# Patient Record
Sex: Female | Born: 1970 | Race: White | Hispanic: No | Marital: Married | Smoking: Never smoker
Health system: Southern US, Community
[De-identification: ages and names within clinical notes are randomized; demographics above are authoritative.]

## PROBLEM LIST (undated history)

## (undated) DIAGNOSIS — G35 Multiple sclerosis: Secondary | ICD-10-CM

## (undated) DIAGNOSIS — C55 Malignant neoplasm of uterus, part unspecified: Secondary | ICD-10-CM

---

## 1998-09-07 DIAGNOSIS — C55 Malignant neoplasm of uterus, part unspecified: Secondary | ICD-10-CM

## 1998-09-07 HISTORY — DX: Malignant neoplasm of uterus, part unspecified: C55

## 1998-09-07 HISTORY — PX: ABDOMINAL HYSTERECTOMY: SHX81

## 2001-03-15 ENCOUNTER — Other Ambulatory Visit: Admission: RE | Admit: 2001-03-15 | Discharge: 2001-03-15 | Payer: Self-pay | Admitting: Obstetrics and Gynecology

## 2001-12-28 ENCOUNTER — Encounter: Payer: Self-pay | Admitting: Obstetrics and Gynecology

## 2001-12-28 ENCOUNTER — Ambulatory Visit (HOSPITAL_COMMUNITY): Admission: RE | Admit: 2001-12-28 | Discharge: 2001-12-28 | Payer: Self-pay | Admitting: Obstetrics and Gynecology

## 2002-05-16 ENCOUNTER — Other Ambulatory Visit: Admission: RE | Admit: 2002-05-16 | Discharge: 2002-05-16 | Payer: Self-pay | Admitting: Obstetrics and Gynecology

## 2002-11-24 ENCOUNTER — Encounter: Payer: Self-pay | Admitting: Emergency Medicine

## 2002-11-24 ENCOUNTER — Emergency Department (HOSPITAL_COMMUNITY): Admission: EM | Admit: 2002-11-24 | Discharge: 2002-11-24 | Payer: Self-pay | Admitting: Emergency Medicine

## 2003-06-20 ENCOUNTER — Other Ambulatory Visit: Admission: RE | Admit: 2003-06-20 | Discharge: 2003-06-20 | Payer: Self-pay | Admitting: Obstetrics and Gynecology

## 2004-07-10 ENCOUNTER — Other Ambulatory Visit: Admission: RE | Admit: 2004-07-10 | Discharge: 2004-07-10 | Payer: Self-pay | Admitting: Obstetrics and Gynecology

## 2005-11-12 ENCOUNTER — Other Ambulatory Visit: Admission: RE | Admit: 2005-11-12 | Discharge: 2005-11-12 | Payer: Self-pay | Admitting: Obstetrics and Gynecology

## 2012-07-08 ENCOUNTER — Encounter (HOSPITAL_COMMUNITY): Payer: Self-pay | Admitting: Emergency Medicine

## 2012-07-08 ENCOUNTER — Emergency Department (INDEPENDENT_AMBULATORY_CARE_PROVIDER_SITE_OTHER)
Admission: EM | Admit: 2012-07-08 | Discharge: 2012-07-08 | Disposition: A | Discharge status: Home / Self Care | Payer: BC Managed Care – PPO | Source: Home / Self Care

## 2012-07-08 DIAGNOSIS — H538 Other visual disturbances: Secondary | ICD-10-CM

## 2012-07-08 DIAGNOSIS — H532 Diplopia: Secondary | ICD-10-CM

## 2012-07-08 NOTE — ED Provider Notes (Signed)
History     CSN: [id number]  Arrival date & time 07/08/12  1143   None     Chief Complaint  Patient presents with  . Visual Field Change    (Consider location/radiation/quality/duration/timing/severity/associated sxs/prior treatment) HPI Comments: One-week ago, this 41 year old female experiencing acute onset of being off balance. Ambulation. Check. Trouble focusing particularly with of right followed by diplopia. Diplopia was a vertical and did get worse over. Couple of days, but has been stable for the past 4-5 days. She saw an optometrist, who told her that she had a normal eye exam shows a saw her PCP that told her that she probably has an upper respirator infection and was given symptomatic medications for that including a nasal spray meclizine and an antibiotic. At times when gazing right or left shoulder become nauseated and dizzy. Other times when looking straight ahead. The images will drop or fall in her words. She denies problems with speech, hearing or swallowing, unilateral paresthesias, or weakness, incontinence, abdominal pain, chest pain, shortness of breath.   History reviewed. No pertinent past medical history.  Past Surgical History  Procedure Date  . Abdominal hysterectomy     No family history on file.  History  Substance Use Topics  . Smoking status: Never Smoker   . Smokeless tobacco: Not on file  . Alcohol Use: No    OB History    Grav Para Term Preterm Abortions TAB SAB Ect Mult Living                  Review of Systems  Constitutional: Negative for fever and activity change.  HENT: Positive for neck stiffness. Negative for hearing loss, ear pain, nosebleeds, facial swelling, rhinorrhea, trouble swallowing, dental problem, postnasal drip and tinnitus.   Eyes: Positive for visual disturbance. Negative for photophobia, pain, discharge, redness and itching.  Cardiovascular: Negative.   Gastrointestinal: Positive for nausea. Negative for vomiting,  abdominal pain and blood in stool.  Genitourinary: Negative.   Musculoskeletal: Positive for myalgias. Negative for back pain.  Skin: Negative.   Neurological: Positive for dizziness, facial asymmetry and light-headedness. Negative for tremors, syncope, speech difficulty and weakness.  Psychiatric/Behavioral: Negative.     Allergies  Review of patient's allergies indicates no known allergies.  Home Medications   Current Outpatient Rx  Name Route Sig Dispense Refill  . ESTROGENS CONJUGATED 0.9 MG PO TABS Oral Take 0.9 mg by mouth daily. Take daily for 21 days then do not take for 7 days.    Marland Kitchen FLUTICASONE FUROATE 27.5 MCG/SPRAY NA SUSP Nasal Place 2 sprays into the nose daily.    Marland Kitchen MECLIZINE HCL 25 MG PO TABS Oral Take 25 mg by mouth 3 (three) times daily as needed.    Marland Kitchen BACTRIM PO Oral Take by mouth.      BP 149/81  Pulse 88  Temp 98.2 F (36.8 C) (Oral)  Resp 18  SpO2 100%  Physical Exam  Constitutional: She is oriented to person, place, and time. She appears well-developed and well-nourished.  HENT:  Head: Normocephalic and atraumatic.  Right Ear: External ear normal.  Left Ear: External ear normal.  Mouth/Throat: Oropharynx is clear and moist. No oropharyngeal exudate.  Neck: Normal range of motion. Neck supple.  Cardiovascular: Normal rate and normal heart sounds.   Pulmonary/Chest: Effort normal and breath sounds normal. No respiratory distress.  Abdominal: Soft. There is no tenderness.  Musculoskeletal: She exhibits no edema and no tenderness.  Neurological: She is alert and oriented to  person, place, and time. She displays no atrophy and no tremor. A cranial nerve deficit is present. No sensory deficit. She exhibits normal muscle tone. She displays no seizure activity. GCS eye subscore is 4. GCS verbal subscore is 5. GCS motor subscore is 6.       Positive for nystagmus with right and lateral gaze. Following the examiner's finger bilaterally. There is a lag of the eyes  especially the left when following the finger from one side to the other. There is also fatiguing evidenced by the eyes rolling in a downward-looking  fashion and When attempting to remain parallel to the floor when staring straight ahead at a distant object. Pupils equal, and reactive to light  Skin: Skin is warm and dry.    ED Course  Procedures (including critical care time)  Labs Reviewed - No data to display No results found.   1. Double vision with both eyes open   2. Blurred vision, bilateral       MDM  Differential may include cranial nerve 3 or 4 palsy Myasthenia gravis Multiple sclerosis Mass effect She is been stable since the onset on Friday, so do not feel that she needs to go to the emergency department. At this time. Also consulted with Dr. Lorenz Coaster I spoke with the receptionist at for neurologic center and the earliest time. They can get her in would be from November 6 at 1:30 PM, so we accepted this up appointment. The patient is instructed that if she develops additional symptoms or worsening of her current symptoms. She is to go to the emergency Department promptly or call EMS. Such symptoms could include worsening of double vision, vertigo, unusual sleepiness or confusion or disorientation, weakness, or paresthesias on one side of the body, incontinence, changes in speech, hearing, or swallowing.        Hayden Rasmussen, NP 07/08/12 1555

## 2012-07-08 NOTE — ED Notes (Signed)
Pt c/o blurry vision x1 week... Saw her optometrist Friday of last week and they told her everything looked normal and to f/u w/PCP... Saw PCP on Tuesday of this week and labs and visit was normal... Pt states that her vision is blurry when she has both eyes open but if she is to close one eye (left or right) she can see fine .Marland Kitchen Bought and eye patch but will get headaches after a while... Pt is alert w/no signs of distress.

## 2012-07-08 NOTE — ED Provider Notes (Signed)
Medical screening examination/treatment/procedure(s) were performed by non-physician practitioner and as supervising physician I was immediately available for consultation/collaboration.  Leslee Home, M.D.   Reuben Likes, MD 07/08/12 [phone]

## 2012-07-13 ENCOUNTER — Encounter (HOSPITAL_COMMUNITY): Payer: Self-pay | Admitting: Emergency Medicine

## 2012-07-13 ENCOUNTER — Emergency Department (HOSPITAL_COMMUNITY): Payer: BC Managed Care – PPO

## 2012-07-13 ENCOUNTER — Inpatient Hospital Stay (HOSPITAL_COMMUNITY)
Admission: EM | Admit: 2012-07-13 | Discharge: 2012-07-16 | DRG: 013 | Disposition: A | Discharge status: Home / Self Care | Payer: BC Managed Care – PPO | Attending: Family Medicine | Admitting: Family Medicine

## 2012-07-13 DIAGNOSIS — H5500 Unspecified nystagmus: Secondary | ICD-10-CM | POA: Diagnosis present

## 2012-07-13 DIAGNOSIS — G35 Multiple sclerosis: Principal | ICD-10-CM | POA: Diagnosis present

## 2012-07-13 DIAGNOSIS — Z8542 Personal history of malignant neoplasm of other parts of uterus: Secondary | ICD-10-CM

## 2012-07-13 DIAGNOSIS — H532 Diplopia: Secondary | ICD-10-CM | POA: Diagnosis present

## 2012-07-13 DIAGNOSIS — Z79899 Other long term (current) drug therapy: Secondary | ICD-10-CM

## 2012-07-13 HISTORY — DX: Multiple sclerosis: G35

## 2012-07-13 HISTORY — DX: Malignant neoplasm of uterus, part unspecified: C55

## 2012-07-13 LAB — URINALYSIS, ROUTINE W REFLEX MICROSCOPIC
Glucose, UA: NEGATIVE mg/dL
Leukocytes, UA: NEGATIVE
pH: 8 (ref 5.0–8.0)

## 2012-07-13 LAB — CBC WITH DIFFERENTIAL/PLATELET
Eosinophils Relative: 0 % (ref 0–5)
Lymphocytes Relative: 7 % — ABNORMAL LOW (ref 12–46)
Lymphs Abs: 1.3 10*3/uL (ref 0.7–4.0)
MCV: 89.6 fL (ref 78.0–100.0)
Neutrophils Relative %: 87 % — ABNORMAL HIGH (ref 43–77)
Platelets: 263 10*3/uL (ref 150–400)
RBC: 4.12 MIL/uL (ref 3.87–5.11)
WBC: 17.9 10*3/uL — ABNORMAL HIGH (ref 4.0–10.5)

## 2012-07-13 LAB — COMPREHENSIVE METABOLIC PANEL
Albumin: 3.8 g/dL (ref 3.5–5.2)
BUN: 11 mg/dL (ref 6–23)
Calcium: 9.4 mg/dL (ref 8.4–10.5)
Chloride: 100 mEq/L (ref 96–112)
Creatinine, Ser: 0.79 mg/dL (ref 0.50–1.10)
Total Bilirubin: 0.3 mg/dL (ref 0.3–1.2)
Total Protein: 7 g/dL (ref 6.0–8.3)

## 2012-07-13 LAB — APTT: aPTT: 30 seconds (ref 24–37)

## 2012-07-13 LAB — PROTIME-INR
INR: 1.07 (ref 0.00–1.49)
Prothrombin Time: 13.8 seconds (ref 11.6–15.2)

## 2012-07-13 MED ORDER — SENNOSIDES-DOCUSATE SODIUM 8.6-50 MG PO TABS: 1.0000 | ORAL_TABLET | Freq: Every evening | ORAL | Status: DC | PRN

## 2012-07-13 MED ORDER — ONDANSETRON HCL 4 MG PO TABS: 4.0000 mg | ORAL_TABLET | Freq: Four times a day (QID) | ORAL | Status: DC | PRN

## 2012-07-13 MED ORDER — ALUM & MAG HYDROXIDE-SIMETH 200-200-20 MG/5ML PO SUSP: 30.0000 mL | Freq: Four times a day (QID) | ORAL | Status: DC | PRN

## 2012-07-13 MED ORDER — SODIUM CHLORIDE 0.9 % IV SOLN
1000.0000 mg | INTRAVENOUS | Status: DC
  Filled 2012-07-13: qty 8

## 2012-07-13 MED ORDER — ENOXAPARIN SODIUM 40 MG/0.4ML ~~LOC~~ SOLN
40.0000 mg | SUBCUTANEOUS | Status: DC
  Administered 2012-07-14 – 2012-07-15 (×2): 40 mg via SUBCUTANEOUS
  Filled 2012-07-13 (×4): qty 0.4

## 2012-07-13 MED ORDER — ACETAMINOPHEN 650 MG RE SUPP: 650.0000 mg | Freq: Four times a day (QID) | RECTAL | Status: DC | PRN

## 2012-07-13 MED ORDER — ONDANSETRON HCL 4 MG/2ML IJ SOLN
4.0000 mg | Freq: Once | INTRAMUSCULAR | Status: AC
  Administered 2012-07-13: 4 mg via INTRAVENOUS
  Filled 2012-07-13: qty 2

## 2012-07-13 MED ORDER — ONDANSETRON HCL 4 MG/2ML IJ SOLN: 4.0000 mg | Freq: Four times a day (QID) | INTRAMUSCULAR | Status: DC | PRN

## 2012-07-13 MED ORDER — MECLIZINE HCL 25 MG PO TABS
25.0000 mg | ORAL_TABLET | Freq: Three times a day (TID) | ORAL | Status: DC | PRN
  Filled 2012-07-13: qty 1

## 2012-07-13 MED ORDER — ACETAMINOPHEN 325 MG PO TABS
650.0000 mg | ORAL_TABLET | Freq: Four times a day (QID) | ORAL | Status: DC | PRN
  Administered 2012-07-13 – 2012-07-16 (×7): 650 mg via ORAL
  Filled 2012-07-13 (×6): qty 2

## 2012-07-13 MED ORDER — ZOLPIDEM TARTRATE 5 MG PO TABS
5.0000 mg | ORAL_TABLET | Freq: Every evening | ORAL | Status: DC | PRN
  Administered 2012-07-15: 5 mg via ORAL
  Filled 2012-07-13: qty 1

## 2012-07-13 MED ORDER — SODIUM CHLORIDE 0.9 % IV BOLUS (SEPSIS)
1000.0000 mL | Freq: Once | INTRAVENOUS | Status: AC
  Administered 2012-07-13: 1000 mL via INTRAVENOUS

## 2012-07-13 NOTE — H&P (Signed)
[facility] History and Physical  Suzanne Robbins ZOX:[id number] DOB: 1970-09-11 DOA: 07/13/2012  Referring physician: EDP PCP: Horald Pollen., PA  Specialists: neurology  Chief Complaint: double vision  HPI: Suzanne Robbins is a 41 y.o. female with history of vague paresthesia for a few months, who presented to the Ed today with progressive worsening diplopia, vertigo, nausea and vomiting - worsening for the past 3 days. She has also having difficulty walking as she has to hold on to the walls not to fall. She denies any fever chills or headache. MRI with demyelinating lesions concerning for MS.    Review of Systems: The patient denies anorexia, fever, weight loss, decreased hearing, hoarseness, chest pain, syncope, dyspnea on exertion, peripheral edema, balance deficits, hemoptysis, abdominal pain, melena, hematochezia, severe indigestion/heartburn, hematuria, incontinence, genital sores, muscle weakness, suspicious skin lesions, transient blindness,depression, unusual weight change, abnormal bleeding, enlarged lymph nodes, angioedema, and breast masses.    Past Medical History  Diagnosis Date  . Uterine cancer    Past Surgical History  Procedure Date  . Abdominal hysterectomy 2000   Social History:  reports that she has never smoked. She does not have any smokeless tobacco history on file. She reports that she does not drink alcohol or use illicit drugs. Lives at home with daughter   works as a Psychologist, occupational  No Known Allergies  Family History  Problem Relation Age of Onset  . Lung cancer Father     Prior to Admission medications   Medication Sig Start Date End Date Taking? Authorizing Provider  Ascorbic Acid (VITAMIN C PO) Take 1 tablet by mouth daily.   Yes Historical Provider, MD  estrogens, conjugated, (PREMARIN) 0.9 MG tablet Take 0.9 mg by mouth daily. Take daily for 21 days then do not take for 7 days.   Yes Historical Provider, MD  fluticasone (VERAMYST) 27.5 MCG/SPRAY  nasal spray Place 2 sprays into the nose daily.   Yes Historical Provider, MD  ibuprofen (ADVIL,MOTRIN) 200 MG tablet Take 400 mg by mouth every 6 (six) hours as needed. For pain   Yes Historical Provider, MD  meclizine (ANTIVERT) 25 MG tablet Take 25 mg by mouth 3 (three) times daily as needed. For dizziness   Yes Historical Provider, MD   Physical Exam: Filed Vitals:   07/13/12 1213 07/13/12 1441 07/13/12 1640  BP: 126/73 112/65 116/70  Pulse: 94 78   Temp: 97.8 F (36.6 C) 98.4 F (36.9 C)   TempSrc: Oral Oral   Resp: 16 18 16   SpO2: 100% 100% 100%     General:  Axox3, no acute distress  Eyes: pupils are 4 mm symmetric, she has strabismus   ENT: no pharyngeal erythema   Neck: no JVD  Cardiovascular: RRR, no M, R,G   Respiratory: ctab no W,R,C  Abdomen: soft, NT, Bs present   Skin: warm, dry no rashes   Musculoskeletal: intact   Psychiatric: euthymic  Neurologic:  III,IV, VI: Horizontal nystagmus on lateral gaze right eye, decreased downward gaze on right eye. ,strength 5/5 all 4  Labs on Admission:  Basic Metabolic Panel:  Lab 07/13/12 8119  NA 136  K 4.0  CL 100  CO2 26  GLUCOSE 116*  BUN 11  CREATININE 0.79  CALCIUM 9.4  MG --  PHOS --   Liver Function Tests:  Lab 07/13/12 1216  AST 20  ALT 14  ALKPHOS 66  BILITOT 0.3  PROT 7.0  ALBUMIN 3.8   No results found for this basename: LIPASE:5,AMYLASE:5  in the last 168 hours No results found for this basename: AMMONIA:5 in the last 168 hours CBC:  Lab 07/13/12 1216  WBC 17.9*  NEUTROABS 15.6*  HGB 12.6  HCT 36.9  MCV 89.6  PLT 263   Cardiac Enzymes: No results found for this basename: CKTOTAL:5,CKMB:5,CKMBINDEX:5,TROPONINI:5 in the last 168 hours  BNP (last 3 results) No results found for this basename: PROBNP:3 in the last 8760 hours CBG: No results found for this basename: GLUCAP:5 in the last 168 hours  Radiological Exams on Admission: Mr Shirlee Latch Wo Contrast  07/13/2012   *RADIOLOGY REPORT*  Clinical Data:  Dizziness with blurred vision.  MRI HEAD WITHOUT CONTRAST MRA HEAD WITHOUT CONTRAST  Technique:  Multiplanar, multiecho pulse sequences of the brain and surrounding structures were obtained without intravenous contrast. Angiographic images of the head were obtained using MRA technique without contrast.  Comparison:   None  MRI HEAD  Findings:  There is an acute periventricular white matter lesion affecting the right optic radiation (image 12 series 4, arrow). Its periventricular location, as well as the presence of numerous other white matter lesions, many also periventricular and ovoid, raises the question of acute and chronic MS.  There is no hemorrhage, mass lesion, cortical involvement, hydrocephalus, or extra-axial fluid.  There is slight premature atrophy for age.  Numerous periventricular lesions are seen which do not show restricted diffusion.  Some of these display central brain substance loss or encephalomalacia.  One lesion involves the mammillothalamic tract on the right at the level of the anterior third ventricle, and location unusual for chronic microvascular ischemic change.  No other brainstem or cerebellar involvement although numerous non cystic periventricular lesions can be seen in both temporal lobes.  The carotid and basilar arteries widely patent.  The orbits appear negative.  There is no midline shift.  Unremarkable pituitary and cerebellar tonsils.  No visible upper cervical lesions.  IMPRESSION: Numerous white matter lesions which are predominately periventricular location suspicious for chronic MS, with a suspected acute demyelinating lesion in the right temporal lobe periventricular optic radiation.  Mild premature atrophy.  MRA HEAD  Findings: The internal carotid arteries are widely patent.  The basilar artery is widely patent with vertebrals codominant.  There is no intracranial stenosis or aneurysm.  IMPRESSION: Negative exam.   Original Report  Authenticated By: Davonna Belling, M.D.    Mr Brain Wo Contrast  07/13/2012  *RADIOLOGY REPORT*  Clinical Data:  Dizziness with blurred vision.  MRI HEAD WITHOUT CONTRAST MRA HEAD WITHOUT CONTRAST  Technique:  Multiplanar, multiecho pulse sequences of the brain and surrounding structures were obtained without intravenous contrast. Angiographic images of the head were obtained using MRA technique without contrast.  Comparison:   None  MRI HEAD  Findings:  There is an acute periventricular white matter lesion affecting the right optic radiation (image 12 series 4, arrow). Its periventricular location, as well as the presence of numerous other white matter lesions, many also periventricular and ovoid, raises the question of acute and chronic MS.  There is no hemorrhage, mass lesion, cortical involvement, hydrocephalus, or extra-axial fluid.  There is slight premature atrophy for age.  Numerous periventricular lesions are seen which do not show restricted diffusion.  Some of these display central brain substance loss or encephalomalacia.  One lesion involves the mammillothalamic tract on the right at the level of the anterior third ventricle, and location unusual for chronic microvascular ischemic change.  No other brainstem or cerebellar involvement although numerous non cystic periventricular  lesions can be seen in both temporal lobes.  The carotid and basilar arteries widely patent.  The orbits appear negative.  There is no midline shift.  Unremarkable pituitary and cerebellar tonsils.  No visible upper cervical lesions.  IMPRESSION: Numerous white matter lesions which are predominately periventricular location suspicious for chronic MS, with a suspected acute demyelinating lesion in the right temporal lobe periventricular optic radiation.  Mild premature atrophy.  MRA HEAD  Findings: The internal carotid arteries are widely patent.  The basilar artery is widely patent with vertebrals codominant.  There is no  intracranial stenosis or aneurysm.  IMPRESSION: Negative exam.   Original Report Authenticated By: Davonna Belling, M.D.       Assessment/Plan Principal Problem:  *Multiple sclerosis   1. Demyelinating disease - most likely MS plan for LP in AM per neuro to r/o viral meningitis - highly unlikely. Patient did receive 1 dose of pulse dose methylprednisolone on 07/13/12 . Further treatment per neurology.    Code Status: full Family Communication: mother Disposition Plan: home  (anticipated LOS 2-4 days )  Time spent: 45 minutes   Myrl Bynum [facility] Pager [phone]  If 7PM-7AM, please contact night-coverage [email or web address] Password [facility] 07/13/2012, 5:57 PM

## 2012-07-13 NOTE — ED Notes (Signed)
Regular Diet Tray ordered spoke w/Felicia

## 2012-07-13 NOTE — ED Notes (Signed)
Was seen for same issues of blurred vision/dizzy on 11/1 and had appointment today to see a dr and she started to vomit so mom brought her back to er

## 2012-07-13 NOTE — ED Notes (Signed)
Larita Fife PA Neurology at bedside.

## 2012-07-13 NOTE — ED Notes (Addendum)
Pt. Transferred to CDU 4, Pt.s bed assignment is complete.  Tammy Sours, RN will call report for this pt.  Neurologist at the bedside

## 2012-07-13 NOTE — ED Provider Notes (Signed)
Medical screening examination/treatment/procedure(s) were performed by non-physician practitioner and as supervising physician I was immediately available for consultation/collaboration.    Nelia Shi, MD 07/13/12 [phone]

## 2012-07-13 NOTE — ED Notes (Signed)
Patient seen recently at Urgent care and referred to Neurologist today did not go to appointment because patient developed nausea and emesis today. States depending on movement does become more dizzy with quick movements.  Airway intact bilateral equal chest rise and fall.  Patient resting at home since the appointment at Urgent care and one day ago fell trying to get the mail and denies hitting head braced fall with palm of hands. Denies pain currently.

## 2012-07-13 NOTE — ED Provider Notes (Signed)
History     CSN: [id number]  Arrival date & time 07/13/12  1205   First MD Initiated Contact with Patient 07/13/12 1502      No chief complaint on file.   (Consider location/radiation/quality/duration/timing/severity/associated sxs/prior treatment) HPI Comments: 41 y/o female presents to the ED complaining of worsening nausea and blurred vision since being seen at Urgent Care on 11/1. She was seen there due to "not feeling right" explained as nauseated, unsteady and blurred/double vision worse in her right eye. Prior to going to urgent care, she went to her eye doctor who told her there was nothing wrong with her eyes and to see her PCP. She then went to her PCP who told her she was congested, put her on bactrim, gave antivert for nausea, and scheduled a neurology appointment for today. She woke up this morning and felt really nauseated, vomited, got slightly confused, had no relief with antivert so decided to come to the ED. States back in September she had 2 weeks of numbness/tingling in her left leg, and when that went away she got 2 weeks of numbness/tingling in her left shoulder. When that went away, her eye symptoms began. Admits to occasional headaches in the front of her head and neck "soreness". She is very nervous.  The history is provided by the patient.    Past Medical History  Diagnosis Date  . Cancer     Past Surgical History  Procedure Date  . Abdominal hysterectomy     No family history on file.  History  Substance Use Topics  . Smoking status: Never Smoker   . Smokeless tobacco: Not on file  . Alcohol Use: No    OB History    Grav Para Term Preterm Abortions TAB SAB Ect Mult Living                  Review of Systems  Constitutional: Positive for activity change and fatigue. Negative for fever and chills.  HENT: Positive for neck pain. Negative for congestion, sore throat, trouble swallowing, neck stiffness and sinus pressure.   Eyes: Positive for visual  disturbance. Negative for pain.  Respiratory: Negative for shortness of breath.   Cardiovascular: Negative for chest pain and leg swelling.  Gastrointestinal: Positive for nausea and vomiting. Negative for abdominal pain, diarrhea and constipation.  Genitourinary: Negative.   Musculoskeletal: Negative for back pain.  Skin: Negative for pallor and rash.  Neurological: Positive for facial asymmetry, weakness, numbness and headaches.  Psychiatric/Behavioral: Positive for confusion. The patient is nervous/anxious.     Allergies  Review of patient's allergies indicates no known allergies.  Home Medications   Current Outpatient Rx  Name  Route  Sig  Dispense  Refill  . ESTROGENS CONJUGATED 0.9 MG PO TABS   Oral   Take 0.9 mg by mouth daily. Take daily for 21 days then do not take for 7 days.         Marland Kitchen FLUTICASONE FUROATE 27.5 MCG/SPRAY NA SUSP   Nasal   Place 2 sprays into the nose daily.         Marland Kitchen MECLIZINE HCL 25 MG PO TABS   Oral   Take 25 mg by mouth 3 (three) times daily as needed. For dizziness         . BACTRIM PO   Oral   Take by mouth.           BP 112/65  Pulse 78  Temp 98.4 F (36.9 C) (Oral)  Resp  18  SpO2 100%  Physical Exam  Nursing note and vitals reviewed. Constitutional: She is oriented to person, place, and time. She appears well-developed and well-nourished. No distress.       Nervous, tearful  HENT:  Head: Normocephalic and atraumatic.  Mouth/Throat: Oropharynx is clear and moist.  Eyes: Conjunctivae normal are normal. Pupils are equal, round, and reactive to light. Right eye exhibits nystagmus (at rest and lateral gaze). Left eye exhibits nystagmus (lateral gaze).  Neck: Normal range of motion. Neck supple. No spinous process tenderness and no muscular tenderness present.  Cardiovascular: Normal rate, regular rhythm, normal heart sounds and intact distal pulses.        No extremity edema  Pulmonary/Chest: Effort normal and breath sounds  normal. She has no decreased breath sounds.  Abdominal: Soft. Bowel sounds are normal. There is no tenderness.  Musculoskeletal: Normal range of motion.  Neurological: She is alert and oriented to person, place, and time. She has normal strength. A cranial nerve deficit (see eye exam) is present. Coordination (positive Romberg ) and gait (unsteady) abnormal.  Skin: Skin is warm, dry and intact. No rash noted.  Psychiatric: Her speech is normal and behavior is normal. Thought content normal. Her mood appears anxious. Cognition and memory are normal.    ED Course  Procedures (including critical care time)  Labs Reviewed  COMPREHENSIVE METABOLIC PANEL - Abnormal; Notable for the following:    Glucose, Bld 116 (*)     All other components within normal limits  CBC WITH DIFFERENTIAL - Abnormal; Notable for the following:    WBC 17.9 (*)     Neutrophils Relative 87 (*)     Neutro Abs 15.6 (*)     Lymphocytes Relative 7 (*)     All other components within normal limits   Mr Shirlee Latch Wo Contrast  07/13/2012  *RADIOLOGY REPORT*  Clinical Data:  Dizziness with blurred vision.  MRI HEAD WITHOUT CONTRAST MRA HEAD WITHOUT CONTRAST  Technique:  Multiplanar, multiecho pulse sequences of the brain and surrounding structures were obtained without intravenous contrast. Angiographic images of the head were obtained using MRA technique without contrast.  Comparison:   None  MRI HEAD  Findings:  There is an acute periventricular white matter lesion affecting the right optic radiation (image 12 series 4, arrow). Its periventricular location, as well as the presence of numerous other white matter lesions, many also periventricular and ovoid, raises the question of acute and chronic MS.  There is no hemorrhage, mass lesion, cortical involvement, hydrocephalus, or extra-axial fluid.  There is slight premature atrophy for age.  Numerous periventricular lesions are seen which do not show restricted diffusion.  Some of  these display central brain substance loss or encephalomalacia.  One lesion involves the mammillothalamic tract on the right at the level of the anterior third ventricle, and location unusual for chronic microvascular ischemic change.  No other brainstem or cerebellar involvement although numerous non cystic periventricular lesions can be seen in both temporal lobes.  The carotid and basilar arteries widely patent.  The orbits appear negative.  There is no midline shift.  Unremarkable pituitary and cerebellar tonsils.  No visible upper cervical lesions.  IMPRESSION: Numerous white matter lesions which are predominately periventricular location suspicious for chronic MS, with a suspected acute demyelinating lesion in the right temporal lobe periventricular optic radiation.  Mild premature atrophy.  MRA HEAD  Findings: The internal carotid arteries are widely patent.  The basilar artery is widely patent with vertebrals  codominant.  There is no intracranial stenosis or aneurysm.  IMPRESSION: Negative exam.   Original Report Authenticated By: Davonna Belling, M.D.    Mr Brain Wo Contrast  07/13/2012  *RADIOLOGY REPORT*  Clinical Data:  Dizziness with blurred vision.  MRI HEAD WITHOUT CONTRAST MRA HEAD WITHOUT CONTRAST  Technique:  Multiplanar, multiecho pulse sequences of the brain and surrounding structures were obtained without intravenous contrast. Angiographic images of the head were obtained using MRA technique without contrast.  Comparison:   None  MRI HEAD  Findings:  There is an acute periventricular white matter lesion affecting the right optic radiation (image 12 series 4, arrow). Its periventricular location, as well as the presence of numerous other white matter lesions, many also periventricular and ovoid, raises the question of acute and chronic MS.  There is no hemorrhage, mass lesion, cortical involvement, hydrocephalus, or extra-axial fluid.  There is slight premature atrophy for age.  Numerous  periventricular lesions are seen which do not show restricted diffusion.  Some of these display central brain substance loss or encephalomalacia.  One lesion involves the mammillothalamic tract on the right at the level of the anterior third ventricle, and location unusual for chronic microvascular ischemic change.  No other brainstem or cerebellar involvement although numerous non cystic periventricular lesions can be seen in both temporal lobes.  The carotid and basilar arteries widely patent.  The orbits appear negative.  There is no midline shift.  Unremarkable pituitary and cerebellar tonsils.  No visible upper cervical lesions.  IMPRESSION: Numerous white matter lesions which are predominately periventricular location suspicious for chronic MS, with a suspected acute demyelinating lesion in the right temporal lobe periventricular optic radiation.  Mild premature atrophy.  MRA HEAD  Findings: The internal carotid arteries are widely patent.  The basilar artery is widely patent with vertebrals codominant.  There is no intracranial stenosis or aneurysm.  IMPRESSION: Negative exam.   Original Report Authenticated By: Davonna Belling, M.D.      1. Multiple sclerosis       MDM  Concern for MS. Positive Romberg, positive nystagmus, history of numbness/tingling over different time periods in different parts of body. White count 17.9. Obtaining MRI brain. 4:00 PM Called by MRI asking to obtain MRA as well since it appears she had a mild stroke. Will obtain MRA. Spoke with patient's mom who said her only medical history is uterine cancer which has been gone for over 18 years. States her daughter is under a lot of stress with her job and her son being in the [employer]. 5:06 PM MRI showing chronic MS changes with an active demyelinating lesion in right temporal lobe. Spoke with Dr. Thad Ranger with neurology who would like patient admitted to hospitalist and will see her as soon as he has a chance. 5:22 PM Patient  being admitted to [facility] Dr. Lavera Guise.     Trevor Mace, PA-C 07/13/12 1722

## 2012-07-13 NOTE — ED Notes (Signed)
Was on septra 10/29 /13 and on meclazine for dizziness but she vomited

## 2012-07-13 NOTE — Progress Notes (Signed)
Reason for Consult: weakness, dizziness, blurred vision, abnormal MRI Referring Physician: Dr. Radford Pax  CC: blurred vision, dizziness  HPI: Suzanne Robbins is an 41 y.o. female who started feeling like her left foot was "falling asleep" in September for 2 weeks, resolved spontaneously. Then she had left arm discomfort "deep in her muscles" that she had to massage, but left bruises on her skin, this resolved. Then last week she started having dizzy spells and states that she had right eye deviation upwards and outwards. This caused blurred vision.  As outpatient she was treated for an URI with abx. She was seen at urgent medical and set up with neurology for today, but became so dizzy that she was naseated with vomiting and came to the ED. MRI concerning for multiple sclerosis. She has never had symptoms like this before or had that diagnosis before.  Past Medical History  Diagnosis Date  . Uterine cancer     Past Surgical History  Procedure Date  . Abdominal hysterectomy 2000    Family History  Problem Relation Age of Onset  . Lung cancer Father     -  Hypertension               Mother  Social History:  reports that she has never smoked. She does not have any smokeless tobacco history on file. She reports that she does not drink alcohol or use illicit drugs.  No Known Allergies   Current Facility-Administered Medications  Medication Dose Route Frequency Provider Last Rate Last Dose  . methylPREDNISolone sodium succinate (SOLU-MEDROL) 1,000 mg in sodium chloride 0.9 % 50 mL IVPB  1,000 mg Intravenous Q24H Sorin C Laza, MD      . [COMPLETED] ondansetron [facility]) injection 4 mg  4 mg Intravenous Once Trevor Mace, PA-C   4 mg at 07/13/12 1637  . [COMPLETED] sodium chloride 0.9 % bolus 1,000 mL  1,000 mL Intravenous Once Trevor Mace, PA-C   1,000 mL at 07/13/12 1636   Current Outpatient Prescriptions  Medication Sig Dispense Refill  . Ascorbic Acid (VITAMIN C PO) Take 1 tablet by  mouth daily.      Marland Kitchen estrogens, conjugated, (PREMARIN) 0.9 MG tablet Take 0.9 mg by mouth daily. Take daily for 21 days then do not take for 7 days.      . fluticasone (VERAMYST) 27.5 MCG/SPRAY nasal spray Place 2 sprays into the nose daily.      Marland Kitchen ibuprofen (ADVIL,MOTRIN) 200 MG tablet Take 400 mg by mouth every 6 (six) hours as needed. For pain      . meclizine (ANTIVERT) 25 MG tablet Take 25 mg by mouth 3 (three) times daily as needed. For dizziness         ROS: History obtained from the patient  General ROS: negative for - chills, fever, night sweats, weight gain or weight loss, +fever Psychological ROS: negative for - behavioral disorder, hallucinations, memory difficulties, mood swings or suicidal ideation Ophthalmic ROS: negative for double vision, eye pain or loss of vision, +blurred vision ENT ROS: negative for - epistaxis, nasal discharge, oral lesions, sore throat, tinnitus, +vertigo Allergy and Immunology ROS: negative for - hives or itchy/watery eyes Hematological and Lymphatic ROS: negative for - bleeding problems, bruising or swollen lymph nodes Endocrine ROS: negative for - galactorrhea, hair pattern changes, polydipsia/polyuria or temperature intolerance Respiratory ROS: negative for - cough, hemoptysis, shortness of breath or wheezing Cardiovascular ROS: negative for - chest pain, dyspnea on exertion, edema or irregular heartbeat  Gastrointestinal ROS: negative for - abdominal pain, diarrhea, hematemesis, stool incontinence, +Nausea/vomiting Genito-Urinary ROS: negative for - dysuria, hematuria, incontinence or urinary frequency/urgency Musculoskeletal ROS: negative for - joint swelling, + muscular weakness Neurological ROS: as noted in HPI Dermatological ROS: negative for rash and skin lesion changes   Physical Examination: Blood pressure 116/70, pulse 78, temperature 98.4 F (36.9 C), temperature source Oral, resp. rate 16, SpO2 100.00%.  Skin: warm, dry Lungs: clear  bilaterally Heart: regular Ext: warm, palp dp pulses, no skin lesions  Neurologic Examination Mental Status: Alert, oriented, thought content appropriate.  Speech fluent without evidence of aphasia.  Able to follow 3 step commands without difficulty. Cranial Nerves: II: visual fields grossly normal, pupils equal, round, reactive to light and accommodation III,IV, VI:  Horizontal nystagmus on lateral gaze right eye, decreased downward gaze on right eye. V,VII: smile symmetric, facial light touch sensation normal bilaterally VIII: hearing normal bilaterally IX,X: gag reflex present XI: trapezius strength/neck flexion strength normal bilaterally XII: tongue strength normal  Motor: Right : Upper extremity   5/5    Left:     Upper extremity   5/5  Lower extremity   5/5     Lower extremity   5/5 Tone and bulk:normal tone throughout; no atrophy noted Sensory: Pinprick and light touch intact throughout, bilaterally Deep Tendon Reflexes: 3+ UE, Knees bilaterally, 2+ right ankle, absent left ankle jerk Plantars: Right: downgoing   Left: downgoing Cerebellar: normal finger-to-nose and heel to shin.  Gait not assessed.   Results for orders placed during the hospital encounter of 07/13/12 (from the past 48 hour(s))  COMPREHENSIVE METABOLIC PANEL     Status: Abnormal   Collection Time   07/13/12 12:16 PM      Component Value Range Comment   Sodium 136  135 - 145 mEq/L    Potassium 4.0  3.5 - 5.1 mEq/L    Chloride 100  96 - 112 mEq/L    CO2 26  19 - 32 mEq/L    Glucose, Bld 116 (*) 70 - 99 mg/dL    BUN 11  6 - 23 mg/dL    Creatinine, Ser 4.09  0.50 - 1.10 mg/dL    Calcium 9.4  8.4 - 81.1 mg/dL    Total Protein 7.0  6.0 - 8.3 g/dL    Albumin 3.8  3.5 - 5.2 g/dL    AST 20  0 - 37 U/L    ALT 14  0 - 35 U/L    Alkaline Phosphatase 66  39 - 117 U/L    Total Bilirubin 0.3  0.3 - 1.2 mg/dL    GFR calc non Af Amer >90  >90 mL/min    GFR calc Af Amer >90  >90 mL/min   CBC WITH DIFFERENTIAL      Status: Abnormal   Collection Time   07/13/12 12:16 PM      Component Value Range Comment   WBC 17.9 (*) 4.0 - 10.5 K/uL    RBC 4.12  3.87 - 5.11 MIL/uL    Hemoglobin 12.6  12.0 - 15.0 g/dL    HCT 91.4  78.2 - 95.6 %    MCV 89.6  78.0 - 100.0 fL    MCH 30.6  26.0 - 34.0 pg    MCHC 34.1  30.0 - 36.0 g/dL    RDW 21.3  08.6 - 57.8 %    Platelets 263  150 - 400 K/uL    Neutrophils Relative 87 (*) 43 - 77 %  Neutro Abs 15.6 (*) 1.7 - 7.7 K/uL    Lymphocytes Relative 7 (*) 12 - 46 %    Lymphs Abs 1.3  0.7 - 4.0 K/uL    Monocytes Relative 6  3 - 12 %    Monocytes Absolute 1.0  0.1 - 1.0 K/uL    Eosinophils Relative 0  0 - 5 %    Eosinophils Absolute 0.0  0.0 - 0.7 K/uL    Basophils Relative 0  0 - 1 %    Basophils Absolute 0.0  0.0 - 0.1 K/uL   URINALYSIS, ROUTINE W REFLEX MICROSCOPIC     Status: Normal   Collection Time   07/13/12  5:15 PM      Component Value Range Comment   Color, Urine YELLOW  YELLOW    APPearance CLEAR  CLEAR    Specific Gravity, Urine 1.015  1.005 - 1.030    pH 8.0  5.0 - 8.0    Glucose, UA NEGATIVE  NEGATIVE mg/dL    Hgb urine dipstick NEGATIVE  NEGATIVE    Bilirubin Urine NEGATIVE  NEGATIVE    Ketones, ur NEGATIVE  NEGATIVE mg/dL    Protein, ur NEGATIVE  NEGATIVE mg/dL    Urobilinogen, UA 0.2  0.0 - 1.0 mg/dL    Nitrite NEGATIVE  NEGATIVE    Leukocytes, UA NEGATIVE  NEGATIVE MICROSCOPIC NOT DONE ON URINES WITH NEGATIVE PROTEIN, BLOOD, LEUKOCYTES, NITRITE, OR GLUCOSE <1000 mg/dL.    No results found for this or any previous visit (from the past 240 hour(s)).  Mr Maxine Glenn Head Wo Contrast  07/13/2012  *RADIOLOGY REPORT*  Clinical Data:  Dizziness with blurred vision.  MRI HEAD WITHOUT CONTRAST MRA HEAD WITHOUT CONTRAST  Technique:  Multiplanar, multiecho pulse sequences of the brain and surrounding structures were obtained without intravenous contrast. Angiographic images of the head were obtained using MRA technique without contrast.  Comparison:   None  MRI  HEAD  Findings:  There is an acute periventricular white matter lesion affecting the right optic radiation (image 12 series 4, arrow). Its periventricular location, as well as the presence of numerous other white matter lesions, many also periventricular and ovoid, raises the question of acute and chronic MS.  There is no hemorrhage, mass lesion, cortical involvement, hydrocephalus, or extra-axial fluid.  There is slight premature atrophy for age.  Numerous periventricular lesions are seen which do not show restricted diffusion.  Some of these display central brain substance loss or encephalomalacia.  One lesion involves the mammillothalamic tract on the right at the level of the anterior third ventricle, and location unusual for chronic microvascular ischemic change.  No other brainstem or cerebellar involvement although numerous non cystic periventricular lesions can be seen in both temporal lobes.  The carotid and basilar arteries widely patent.  The orbits appear negative.  There is no midline shift.  Unremarkable pituitary and cerebellar tonsils.  No visible upper cervical lesions.  IMPRESSION: Numerous white matter lesions which are predominately periventricular location suspicious for chronic MS, with a suspected acute demyelinating lesion in the right temporal lobe periventricular optic radiation.  Mild premature atrophy.  MRA HEAD  Findings: The internal carotid arteries are widely patent.  The basilar artery is widely patent with vertebrals codominant.  There is no intracranial stenosis or aneurysm.  IMPRESSION: Negative exam.   Original Report Authenticated By: Davonna Belling, M.D.    Mr Brain Wo Contrast  07/13/2012  *RADIOLOGY REPORT*  Clinical Data:  Dizziness with blurred vision.  MRI HEAD WITHOUT  CONTRAST MRA HEAD WITHOUT CONTRAST  Technique:  Multiplanar, multiecho pulse sequences of the brain and surrounding structures were obtained without intravenous contrast. Angiographic images of the head were  obtained using MRA technique without contrast.  Comparison:   None  MRI HEAD  Findings:  There is an acute periventricular white matter lesion affecting the right optic radiation (image 12 series 4, arrow). Its periventricular location, as well as the presence of numerous other white matter lesions, many also periventricular and ovoid, raises the question of acute and chronic MS.  There is no hemorrhage, mass lesion, cortical involvement, hydrocephalus, or extra-axial fluid.  There is slight premature atrophy for age.  Numerous periventricular lesions are seen which do not show restricted diffusion.  Some of these display central brain substance loss or encephalomalacia.  One lesion involves the mammillothalamic tract on the right at the level of the anterior third ventricle, and location unusual for chronic microvascular ischemic change.  No other brainstem or cerebellar involvement although numerous non cystic periventricular lesions can be seen in both temporal lobes.  The carotid and basilar arteries widely patent.  The orbits appear negative.  There is no midline shift.  Unremarkable pituitary and cerebellar tonsils.  No visible upper cervical lesions.  IMPRESSION: Numerous white matter lesions which are predominately periventricular location suspicious for chronic MS, with a suspected acute demyelinating lesion in the right temporal lobe periventricular optic radiation.  Mild premature atrophy.  MRA HEAD  Findings: The internal carotid arteries are widely patent.  The basilar artery is widely patent with vertebrals codominant.  There is no intracranial stenosis or aneurysm.  IMPRESSION: Negative exam.   Original Report Authenticated By: Davonna Belling, M.D.       Job Founds, MBA, MHA [facility] Pager [phone]   Patient seen and examined.  Clinical course and management discussed.  Necessary edits performed.  I agree with the above.  Assessment and plan of care developed and discussed  below.    Assessment: 41 year old female with complaints of paresthesias and visual disturbances.  MRI consistent with MS but not confirmatory.  Further studies recommended particularly with elevated WBC count of unclear etiology.  Recommend investigation of differential.  Plan: 1.  LP.  Procedure explained to patient.  Will perform in AM. 2.  Hold antiplatelets and anticoagulants 3.  PT/INR  Thana Farr, MD [facility] [phone]  07/13/2012  7:36 PM

## 2012-07-13 NOTE — ED Notes (Signed)
Do not give Lovenox tonight per hospitalist service. Order to be D/C

## 2012-07-13 NOTE — ED Notes (Addendum)
Called floor room in not ready and still needs to be clean.  Gave floor phone number to call when room is ready.

## 2012-07-14 LAB — CSF CELL COUNT WITH DIFFERENTIAL: WBC, CSF: 3 /mm3 (ref 0–5)

## 2012-07-14 LAB — PROTEIN AND GLUCOSE, CSF
Glucose, CSF: 65 mg/dL (ref 43–76)
Total  Protein, CSF: 29 mg/dL (ref 15–45)

## 2012-07-14 LAB — GRAM STAIN

## 2012-07-14 LAB — CBC
Hemoglobin: 12.3 g/dL (ref 12.0–15.0)
RBC: 4.02 MIL/uL (ref 3.87–5.11)

## 2012-07-14 NOTE — Progress Notes (Signed)
Subjective: Patient awake and alert.  Has both eyes open this morning but still has blurry vision.  Reports no improvement.  Has not received Solumedrol.  Has had less nausea and vomiting.  Objective: Current vital signs: BP 112/66  Pulse 69  Temp 97.9 F (36.6 C) (Oral)  Resp 18  Ht 5\' 3"  (1.6 m)  Wt 74.844 kg (165 lb)  BMI 29.23 kg/m2  SpO2 100% Vital signs in last 24 hours: Temp:  [97.8 F (36.6 C)-98.4 F (36.9 C)] 97.9 F (36.6 C) (11/07 0600) Pulse Rate:  [69-94] 69  (11/07 0600) Resp:  [16-18] 18  (11/07 0600) BP: (112-132)/(65-81) 112/66 mmHg (11/07 0600) SpO2:  [100 %] 100 % (11/07 0600) Weight:  [74.844 kg (165 lb)] 74.844 kg (165 lb) (11/06 2047)  Intake/Output from previous day:   Intake/Output this shift:   Nutritional status: General  Neurologic Exam: Mental Status:  Alert, oriented, thought content appropriate. Speech fluent without evidence of aphasia. Able to follow 3 step commands without difficulty.  Cranial Nerves:  II: visual fields grossly normal, pupils equal, round, reactive to light and accommodation  III,IV, VI: Horizontal nystagmus on lateral gaze right eye, decreased downward gaze on right eye.  V,VII: smile symmetric, facial light touch sensation normal bilaterally  VIII: hearing normal bilaterally  IX,X: gag reflex present  XI: trapezius strength/neck flexion strength normal bilaterally  XII: tongue strength normal  Motor:  Right : Upper extremity 5/5           Left: Upper extremity 5/5   Lower extremity 5/5        Lower extremity 5/5  Tone and bulk:normal tone throughout; no atrophy noted  Sensory: Pinprick and light touch intact throughout, bilaterally  Deep Tendon Reflexes: 3+ UE, Knees bilaterally, 2+ right ankle, absent left ankle jerk  Plantars:  Right: downgoing     Left: downgoing  Cerebellar:  normal finger-to-nose and heel to shin. Gait cautious but narrow based and with symmetric arm swing   Lab Results: Basic Metabolic  Panel:  Lab 07/13/12 1216  NA 136  K 4.0  CL 100  CO2 26  GLUCOSE 116*  BUN 11  CREATININE 0.79  CALCIUM 9.4  MG --  PHOS --    Liver Function Tests:  Lab 07/13/12 1216  AST 20  ALT 14  ALKPHOS 66  BILITOT 0.3  PROT 7.0  ALBUMIN 3.8   No results found for this basename: LIPASE:5,AMYLASE:5 in the last 168 hours No results found for this basename: AMMONIA:3 in the last 168 hours  CBC:  Lab 07/13/12 1216  WBC 17.9*  NEUTROABS 15.6*  HGB 12.6  HCT 36.9  MCV 89.6  PLT 263    Cardiac Enzymes: No results found for this basename: CKTOTAL:5,CKMB:5,CKMBINDEX:5,TROPONINI:5 in the last 168 hours  Lipid Panel: No results found for this basename: CHOL:5,TRIG:5,HDL:5,CHOLHDL:5,VLDL:5,LDLCALC:5 in the last 168 hours  CBG: No results found for this basename: GLUCAP:5 in the last 168 hours  Microbiology: No results found for this or any previous visit.  Coagulation Studies:  Basename 07/13/12 2050  LABPROT 13.8  INR 1.07    Imaging: Mr Shirlee Latch ZO Contrast  07/13/2012  *RADIOLOGY REPORT*  Clinical Data:  Dizziness with blurred vision.  MRI HEAD WITHOUT CONTRAST MRA HEAD WITHOUT CONTRAST  Technique:  Multiplanar, multiecho pulse sequences of the brain and surrounding structures were obtained without intravenous contrast. Angiographic images of the head were obtained using MRA technique without contrast.  Comparison:   None  MRI HEAD  Findings:  There is an acute periventricular white matter lesion affecting the right optic radiation (image 12 series 4, arrow). Its periventricular location, as well as the presence of numerous other white matter lesions, many also periventricular and ovoid, raises the question of acute and chronic MS.  There is no hemorrhage, mass lesion, cortical involvement, hydrocephalus, or extra-axial fluid.  There is slight premature atrophy for age.  Numerous periventricular lesions are seen which do not show restricted diffusion.  Some of these display  central brain substance loss or encephalomalacia.  One lesion involves the mammillothalamic tract on the right at the level of the anterior third ventricle, and location unusual for chronic microvascular ischemic change.  No other brainstem or cerebellar involvement although numerous non cystic periventricular lesions can be seen in both temporal lobes.  The carotid and basilar arteries widely patent.  The orbits appear negative.  There is no midline shift.  Unremarkable pituitary and cerebellar tonsils.  No visible upper cervical lesions.  IMPRESSION: Numerous white matter lesions which are predominately periventricular location suspicious for chronic MS, with a suspected acute demyelinating lesion in the right temporal lobe periventricular optic radiation.  Mild premature atrophy.  MRA HEAD  Findings: The internal carotid arteries are widely patent.  The basilar artery is widely patent with vertebrals codominant.  There is no intracranial stenosis or aneurysm.  IMPRESSION: Negative exam.   Original Report Authenticated By: Davonna Belling, M.D.    Mr Brain Wo Contrast  07/13/2012  *RADIOLOGY REPORT*  Clinical Data:  Dizziness with blurred vision.  MRI HEAD WITHOUT CONTRAST MRA HEAD WITHOUT CONTRAST  Technique:  Multiplanar, multiecho pulse sequences of the brain and surrounding structures were obtained without intravenous contrast. Angiographic images of the head were obtained using MRA technique without contrast.  Comparison:   None  MRI HEAD  Findings:  There is an acute periventricular white matter lesion affecting the right optic radiation (image 12 series 4, arrow). Its periventricular location, as well as the presence of numerous other white matter lesions, many also periventricular and ovoid, raises the question of acute and chronic MS.  There is no hemorrhage, mass lesion, cortical involvement, hydrocephalus, or extra-axial fluid.  There is slight premature atrophy for age.  Numerous periventricular lesions  are seen which do not show restricted diffusion.  Some of these display central brain substance loss or encephalomalacia.  One lesion involves the mammillothalamic tract on the right at the level of the anterior third ventricle, and location unusual for chronic microvascular ischemic change.  No other brainstem or cerebellar involvement although numerous non cystic periventricular lesions can be seen in both temporal lobes.  The carotid and basilar arteries widely patent.  The orbits appear negative.  There is no midline shift.  Unremarkable pituitary and cerebellar tonsils.  No visible upper cervical lesions.  IMPRESSION: Numerous white matter lesions which are predominately periventricular location suspicious for chronic MS, with a suspected acute demyelinating lesion in the right temporal lobe periventricular optic radiation.  Mild premature atrophy.  MRA HEAD  Findings: The internal carotid arteries are widely patent.  The basilar artery is widely patent with vertebrals codominant.  There is no intracranial stenosis or aneurysm.  IMPRESSION: Negative exam.   Original Report Authenticated By: Davonna Belling, M.D.     Medications:  I have reviewed the patient's current medications. Scheduled:   . enoxaparin (LOVENOX) injection  40 mg Subcutaneous Q24H  . [COMPLETED] ondansetron (ZOFRAN) IV  4 mg Intravenous Once  . [COMPLETED] sodium chloride  1,000 mL Intravenous Once  . [DISCONTINUED] methylPREDNISolone (SOLU-MEDROL) injection  1,000 mg Intravenous Q24H    Assessment/Plan:  Patient Active Hospital Problem List: Multiple sclerosis (07/13/2012)   Assessment: Patient symptomatically some better this AM but otherwise unchanged.  Patient to have LP.  Procedure explained.  Consent to be signed.   Plan: LP for diagnosis of MS and rule out of other possible conditions.    LP Procedure Note:  Patient has been seen and examined.  Chart has been reviewed.  LP is being performed to rule out MS.  Procedure  has been explained to patient including risks and benefits.  Consent has been signed by patient and witnessed.   Blood pressure 112/66, pulse 69, temperature 97.9 F (36.6 C), temperature source Oral, resp. rate 18, height 5\' 3"  (1.6 m), weight 74.844 kg (165 lb), SpO2 100.00%.   Basename 07/13/12 2050 07/13/12 1216  WBC -- 17.9*  HGB -- 12.6  HCT -- 36.9  PLT -- 263  INR 1.07 --  PTT -- --    Patient was placed in the sitting position.  Area was cleaned with betadine and anesthetized with lidocaine.  Under sterile conditions 20G LP needle was placed at approximately L3-4 without difficulty.  Opening pressure was normal.  Approximately 22cc of clear and colorless fluid was obtained and sent for studies.  No complications were noted.      LOS: 1 day   Thana Farr, MD [facility] [phone] 07/14/2012  8:20 AM

## 2012-07-14 NOTE — Progress Notes (Signed)
[facility] PROGRESS NOTE  Suzanne Robbins ZOX:[id number] DOB: 1971-08-09 DOA: 07/13/2012 PCP: Horald Pollen., PA  Assessment/Plan: 1. Demyelinating disease: Patient had LP done by neurology this morning, results are pending. To be started on steroids as per neurology. 2. Leukocytosis: Resolved, WBC was 17.9 on admission and now back to normal. Patient denies any symptoms of fever, cough, dysuria, shortness of breath before coming to the hospital. She completed ten day course of antibiotics with bactrim three days before she came to the hospital for ? Infection as per patient. She did not have signs or symptoms of infection. 3.   Code Status: Full Family Communication: Spoke to patient at bedside Disposition Plan: Home once stable   Consultants:  Neurology  Procedures:  Lumbar puncture  Antibiotics:  None  HPI/Subjective: Patient denies fever, no cough, shortness of breath, no dysuria.feels better today, she is s/p Lumbar puncture. Results are pending.  Objective: Filed Vitals:   07/13/12 2030 07/13/12 2047 07/14/12 0600 07/14/12 0954  BP: 132/81  112/66 99/59  Pulse: 87  69 79  Temp: 97.8 F (36.6 C)  97.9 F (36.6 C) 97.9 F (36.6 C)  TempSrc:    Oral  Resp: 18  18 20   Height:  5\' 3"  (1.6 m)    Weight:  74.844 kg (165 lb)    SpO2: 100%  100% 100%   No intake or output data in the 24 hours ending 07/14/12 1116 Filed Weights   07/13/12 2047  Weight: 74.844 kg (165 lb)    Exam:   General: Appear in no acute distress  Cardiovascular: S1S2 regular  Respiratory: Clear bilaterally  Abdomen: Soft, nontender  Ext: No edema  Data Reviewed: Basic Metabolic Panel:  Lab 07/13/12 8119  NA 136  K 4.0  CL 100  CO2 26  GLUCOSE 116*  BUN 11  CREATININE 0.79  CALCIUM 9.4  MG --  PHOS --   Liver Function Tests:  Lab 07/13/12 1216  AST 20  ALT 14  ALKPHOS 66  BILITOT 0.3  PROT 7.0  ALBUMIN 3.8   CBC:  Lab 07/14/12 0906 07/13/12 1216  WBC  9.3 17.9*  NEUTROABS -- 15.6*  HGB 12.3 12.6  HCT 36.3 36.9  MCV 90.3 89.6  PLT 255 263     Studies: Mr Shirlee Latch Wo Contrast  07/13/2012  *RADIOLOGY REPORT*  Clinical Data:  Dizziness with blurred vision.  MRI HEAD WITHOUT CONTRAST MRA HEAD WITHOUT CONTRAST  Technique:  Multiplanar, multiecho pulse sequences of the brain and surrounding structures were obtained without intravenous contrast. Angiographic images of the head were obtained using MRA technique without contrast.  Comparison:   None  MRI HEAD  Findings:  There is an acute periventricular white matter lesion affecting the right optic radiation (image 12 series 4, arrow). Its periventricular location, as well as the presence of numerous other white matter lesions, many also periventricular and ovoid, raises the question of acute and chronic MS.  There is no hemorrhage, mass lesion, cortical involvement, hydrocephalus, or extra-axial fluid.  There is slight premature atrophy for age.  Numerous periventricular lesions are seen which do not show restricted diffusion.  Some of these display central brain substance loss or encephalomalacia.  One lesion involves the mammillothalamic tract on the right at the level of the anterior third ventricle, and location unusual for chronic microvascular ischemic change.  No other brainstem or cerebellar involvement although numerous non cystic periventricular lesions can be seen in both temporal lobes.  The carotid and  basilar arteries widely patent.  The orbits appear negative.  There is no midline shift.  Unremarkable pituitary and cerebellar tonsils.  No visible upper cervical lesions.  IMPRESSION: Numerous white matter lesions which are predominately periventricular location suspicious for chronic MS, with a suspected acute demyelinating lesion in the right temporal lobe periventricular optic radiation.  Mild premature atrophy.  MRA HEAD  Findings: The internal carotid arteries are widely patent.  The basilar  artery is widely patent with vertebrals codominant.  There is no intracranial stenosis or aneurysm.  IMPRESSION: Negative exam.   Original Report Authenticated By: Davonna Belling, M.D.    Mr Brain Wo Contrast  07/13/2012  *RADIOLOGY REPORT*  Clinical Data:  Dizziness with blurred vision.  MRI HEAD WITHOUT CONTRAST MRA HEAD WITHOUT CONTRAST  Technique:  Multiplanar, multiecho pulse sequences of the brain and surrounding structures were obtained without intravenous contrast. Angiographic images of the head were obtained using MRA technique without contrast.  Comparison:   None  MRI HEAD  Findings:  There is an acute periventricular white matter lesion affecting the right optic radiation (image 12 series 4, arrow). Its periventricular location, as well as the presence of numerous other white matter lesions, many also periventricular and ovoid, raises the question of acute and chronic MS.  There is no hemorrhage, mass lesion, cortical involvement, hydrocephalus, or extra-axial fluid.  There is slight premature atrophy for age.  Numerous periventricular lesions are seen which do not show restricted diffusion.  Some of these display central brain substance loss or encephalomalacia.  One lesion involves the mammillothalamic tract on the right at the level of the anterior third ventricle, and location unusual for chronic microvascular ischemic change.  No other brainstem or cerebellar involvement although numerous non cystic periventricular lesions can be seen in both temporal lobes.  The carotid and basilar arteries widely patent.  The orbits appear negative.  There is no midline shift.  Unremarkable pituitary and cerebellar tonsils.  No visible upper cervical lesions.  IMPRESSION: Numerous white matter lesions which are predominately periventricular location suspicious for chronic MS, with a suspected acute demyelinating lesion in the right temporal lobe periventricular optic radiation.  Mild premature atrophy.  MRA HEAD   Findings: The internal carotid arteries are widely patent.  The basilar artery is widely patent with vertebrals codominant.  There is no intracranial stenosis or aneurysm.  IMPRESSION: Negative exam.   Original Report Authenticated By: Davonna Belling, M.D.     Scheduled Meds:   . enoxaparin (LOVENOX) injection  40 mg Subcutaneous Q24H  . [COMPLETED] ondansetron (ZOFRAN) IV  4 mg Intravenous Once  . [COMPLETED] sodium chloride  1,000 mL Intravenous Once  . [DISCONTINUED] methylPREDNISolone (SOLU-MEDROL) injection  1,000 mg Intravenous Q24H   Continuous Infusions:   Principal Problem:  *Multiple sclerosis    Time spent: 35 min    [facility]  [facility] Pager [phone]. If 8PM-8AM, please contact night-coverage at [email or web address], password [facility] 07/14/2012, 11:16 AM  LOS: 1 day

## 2012-07-15 LAB — B. BURGDORFI ANTIBODIES: B burgdorferi Ab IgG+IgM: 0.58 {ISR}

## 2012-07-15 MED ORDER — PANTOPRAZOLE SODIUM 40 MG IV SOLR
40.0000 mg | INTRAVENOUS | Status: DC
  Administered 2012-07-15 – 2012-07-16 (×2): 40 mg via INTRAVENOUS
  Filled 2012-07-15 (×2): qty 40

## 2012-07-15 MED ORDER — METHYLPREDNISOLONE SODIUM SUCC 1000 MG IJ SOLR
1000.0000 mg | Freq: Every day | INTRAMUSCULAR | Status: DC
  Administered 2012-07-15 – 2012-07-16 (×2): 1000 mg via INTRAVENOUS
  Filled 2012-07-15 (×2): qty 8

## 2012-07-15 MED ORDER — ZOLPIDEM TARTRATE 10 MG PO TABS: 10.0000 mg | ORAL_TABLET | Freq: Every evening | ORAL | Status: DC | PRN

## 2012-07-15 NOTE — Progress Notes (Signed)
Subjective: Patient reports that her vision continues to improve.  No further diplopia.  Repeat CBC shows a normal WBC count.  No clinical signs of infection.  Patient afebrile.  No new development of symptoms  Initial LP results unremarkable including preliminary cultures.  MS studies pending.  Objective: Current vital signs: BP 105/54  Pulse 74  Temp 98.2 F (36.8 C) (Oral)  Resp 17  Ht 5\' 3"  (1.6 m)  Wt 74.844 kg (165 lb)  BMI 29.23 kg/m2  SpO2 98% Vital signs in last 24 hours: Temp:  [98 F (36.7 C)-98.3 F (36.8 C)] 98.2 F (36.8 C) (11/08 0557) Pulse Rate:  [70-80] 74  (11/08 0557) Resp:  [17-20] 17  (11/08 0557) BP: (98-120)/(54-66) 105/54 mmHg (11/08 0557) SpO2:  [98 %] 98 % (11/08 0557)  Intake/Output from previous day: 11/07 0701 - 11/08 0700 In: -  Out: 1 [Urine:1] Intake/Output this shift:   Nutritional status: General  Neurologic Exam: Mental Status:  Alert, oriented, thought content appropriate. Speech fluent without evidence of aphasia. Able to follow 3 step commands without difficulty.  Cranial Nerves:  II: visual fields grossly normal, pupils equal, round, reactive to light and accommodation  III,IV, VI: Nystagmus on right lateral gaze, with upward gaze right eye moves up and inward, with downward gaze there is decreased excursion with the left eye. V,VII: smile symmetric, facial light touch sensation normal bilaterally  VIII: hearing normal bilaterally  IX,X: gag reflex present  XI: trapezius strength/neck flexion strength normal bilaterally  XII: tongue strength normal  Motor:  Right : Upper extremity 5/5           Left: Upper extremity 5/5   Lower extremity 5/5        Lower extremity 5/5  Tone and bulk:normal tone throughout; no atrophy noted  Sensory: Pinprick and light touch intact throughout, bilaterally  Deep Tendon Reflexes: 3+ UE, Knees bilaterally, 2+ right ankle, absent left ankle jerk  Plantars:  Right: downgoing    Left: downgoing    Cerebellar:  normal finger-to-nose and heel to shin. Gait cautious but narrow based and with symmetric arm swing   Lab Results: Basic Metabolic Panel:  Lab 07/13/12 1610  NA 136  K 4.0  CL 100  CO2 26  GLUCOSE 116*  BUN 11  CREATININE 0.79  CALCIUM 9.4  MG --  PHOS --    Liver Function Tests:  Lab 07/13/12 1216  AST 20  ALT 14  ALKPHOS 66  BILITOT 0.3  PROT 7.0  ALBUMIN 3.8   No results found for this basename: LIPASE:5,AMYLASE:5 in the last 168 hours No results found for this basename: AMMONIA:3 in the last 168 hours  CBC:  Lab 07/14/12 0906 07/13/12 1216  WBC 9.3 17.9*  NEUTROABS -- 15.6*  HGB 12.3 12.6  HCT 36.3 36.9  MCV 90.3 89.6  PLT 255 263    Cardiac Enzymes: No results found for this basename: CKTOTAL:5,CKMB:5,CKMBINDEX:5,TROPONINI:5 in the last 168 hours  Lipid Panel: No results found for this basename: CHOL:5,TRIG:5,HDL:5,CHOLHDL:5,VLDL:5,LDLCALC:5 in the last 168 hours  CBG: No results found for this basename: GLUCAP:5 in the last 168 hours  Microbiology: Results for orders placed during the hospital encounter of 07/13/12  CSF CULTURE     Status: Normal (Preliminary result)   Collection Time   07/14/12  8:37 AM      Component Value Range Status Comment   Specimen Description CSF   Final    Special Requests 8.0ML   Final  Gram Stain     Final    Value: CYTOSPIN WBC PRESENT, PREDOMINANTLY MONONUCLEAR     NO ORGANISMS SEEN     Performed at [facility]   Culture NO GROWTH   Final    Report Status PENDING   Incomplete   GRAM STAIN     Status: Normal   Collection Time   07/14/12  8:37 AM      Component Value Range Status Comment   Specimen Description CSF   Final    Special Requests 8CC   Final    Gram Stain     Final    Value: CYTOSPIN CSF SLIDE     WBC PRESENT, PREDOMINANTLY MONONUCLEAR     NO ORGANISMS SEEN   Report Status 07/14/2012 FINAL   Final   FUNGUS CULTURE W SMEAR     Status: Normal (Preliminary result)    Collection Time   07/14/12  8:37 AM      Component Value Range Status Comment   Specimen Description CSF   Final    Special Requests 8.0ML   Final    Fungal Smear NO YEAST OR FUNGAL ELEMENTS SEEN   Final    Culture CULTURE IN PROGRESS FOR FOUR WEEKS   Final    Report Status PENDING   Incomplete     Coagulation Studies:  Basename 07/13/12 2050  LABPROT 13.8  INR 1.07    Imaging: Mr Maxine Glenn Head Wo Contrast  07/13/2012  *RADIOLOGY REPORT*  Clinical Data:  Dizziness with blurred vision.  MRI HEAD WITHOUT CONTRAST MRA HEAD WITHOUT CONTRAST  Technique:  Multiplanar, multiecho pulse sequences of the brain and surrounding structures were obtained without intravenous contrast. Angiographic images of the head were obtained using MRA technique without contrast.  Comparison:   None  MRI HEAD  Findings:  There is an acute periventricular white matter lesion affecting the right optic radiation (image 12 series 4, arrow). Its periventricular location, as well as the presence of numerous other white matter lesions, many also periventricular and ovoid, raises the question of acute and chronic MS.  There is no hemorrhage, mass lesion, cortical involvement, hydrocephalus, or extra-axial fluid.  There is slight premature atrophy for age.  Numerous periventricular lesions are seen which do not show restricted diffusion.  Some of these display central brain substance loss or encephalomalacia.  One lesion involves the mammillothalamic tract on the right at the level of the anterior third ventricle, and location unusual for chronic microvascular ischemic change.  No other brainstem or cerebellar involvement although numerous non cystic periventricular lesions can be seen in both temporal lobes.  The carotid and basilar arteries widely patent.  The orbits appear negative.  There is no midline shift.  Unremarkable pituitary and cerebellar tonsils.  No visible upper cervical lesions.  IMPRESSION: Numerous white matter lesions  which are predominately periventricular location suspicious for chronic MS, with a suspected acute demyelinating lesion in the right temporal lobe periventricular optic radiation.  Mild premature atrophy.  MRA HEAD  Findings: The internal carotid arteries are widely patent.  The basilar artery is widely patent with vertebrals codominant.  There is no intracranial stenosis or aneurysm.  IMPRESSION: Negative exam.   Original Report Authenticated By: Davonna Belling, M.D.    Mr Brain Wo Contrast  07/13/2012  *RADIOLOGY REPORT*  Clinical Data:  Dizziness with blurred vision.  MRI HEAD WITHOUT CONTRAST MRA HEAD WITHOUT CONTRAST  Technique:  Multiplanar, multiecho pulse sequences of the brain and surrounding structures were obtained  without intravenous contrast. Angiographic images of the head were obtained using MRA technique without contrast.  Comparison:   None  MRI HEAD  Findings:  There is an acute periventricular white matter lesion affecting the right optic radiation (image 12 series 4, arrow). Its periventricular location, as well as the presence of numerous other white matter lesions, many also periventricular and ovoid, raises the question of acute and chronic MS.  There is no hemorrhage, mass lesion, cortical involvement, hydrocephalus, or extra-axial fluid.  There is slight premature atrophy for age.  Numerous periventricular lesions are seen which do not show restricted diffusion.  Some of these display central brain substance loss or encephalomalacia.  One lesion involves the mammillothalamic tract on the right at the level of the anterior third ventricle, and location unusual for chronic microvascular ischemic change.  No other brainstem or cerebellar involvement although numerous non cystic periventricular lesions can be seen in both temporal lobes.  The carotid and basilar arteries widely patent.  The orbits appear negative.  There is no midline shift.  Unremarkable pituitary and cerebellar tonsils.  No  visible upper cervical lesions.  IMPRESSION: Numerous white matter lesions which are predominately periventricular location suspicious for chronic MS, with a suspected acute demyelinating lesion in the right temporal lobe periventricular optic radiation.  Mild premature atrophy.  MRA HEAD  Findings: The internal carotid arteries are widely patent.  The basilar artery is widely patent with vertebrals codominant.  There is no intracranial stenosis or aneurysm.  IMPRESSION: Negative exam.   Original Report Authenticated By: Davonna Belling, M.D.     Medications:  I have reviewed the patient's current medications. Scheduled:   . enoxaparin (LOVENOX) injection  40 mg Subcutaneous Q24H  . methylPREDNISolone (SOLU-MEDROL) injection  1,000 mg Intravenous Daily  . pantoprazole (PROTONIX) IV  40 mg Intravenous Q24H    Assessment/Plan:  Patient Active Hospital Problem List: Multiple sclerosis (07/13/2012)   Assessment: MS presumed.  LP results pending.  Patient slightly improved since presentation.     Plan:  1.  Start Solumedrol 1000mg  daily IV  2.  Protonix 40mg  IV daily for GI prophylaxis  3.  Ambien prn for possible side effect of insomnia  Case discussed with Dr. Sharl Ma    LOS: 2 days   Thana Farr, MD [facility] [phone] 07/15/2012  11:15 AM

## 2012-07-15 NOTE — Progress Notes (Signed)
[facility]  Patient Status: New  [facility] is providing the following services: RN and Home Infusion Services (teaching and education will be done by nurse in the home with patient and caregiver)  If patient discharges after hours, please call [phone].   Suzanne Robbins 07/15/2012, 2:37 PM

## 2012-07-15 NOTE — Progress Notes (Signed)
[facility] PROGRESS NOTE  Suzanne Robbins ZOX:[id number] DOB: 1970/12/09 DOA: 07/13/2012 PCP: Horald Pollen., PA  Assessment/Plan: 1. Demyelinating disease: Patient had LP done by neurology this morning, results are pending. Patient was started on solumedrol 1 gram iv dail as per neurology. 2. Leukocytosis: Resolved, WBC was 17.9 on admission and now back to normal. Patient denies any symptoms of fever, cough, dysuria, shortness of breath before coming to the hospital. She completed ten day course of antibiotics with bactrim three days before she came to the hospital for ? Infection as per patient. She did not have signs or symptoms of infection. 3.   Code Status: Full Family Communication: Spoke to patient at bedside Disposition Plan: Home once stable   Consultants:  Neurology  Procedures:  Lumbar puncture  Antibiotics:  None  HPI/Subjective: Patient denies fever, no cough, shortness of breath, no dysuria.feels better today, she is s/p Lumbar puncture. Results are pending.  Objective: Filed Vitals:   07/14/12 0954 07/14/12 1338 07/14/12 2129 07/15/12 0557  BP: 99/59 98/57 120/66 105/54  Pulse: 79 80 70 74  Temp: 97.9 F (36.6 C) 98 F (36.7 C) 98.3 F (36.8 C) 98.2 F (36.8 C)  TempSrc: Oral Oral Oral Oral  Resp: 20 20 18 17   Height:      Weight:      SpO2: 100% 98% 98% 98%    Intake/Output Summary (Last 24 hours) at 07/15/12 1418 Last data filed at 07/15/12 0610  Gross per 24 hour  Intake      0 ml  Output      1 ml  Net     -1 ml   Filed Weights   07/13/12 2047  Weight: 74.844 kg (165 lb)    Exam:   General: Appear in no acute distress  Cardiovascular: S1S2 regular  Respiratory: Clear bilaterally  Abdomen: Soft, nontender  Ext: No edema  Data Reviewed: Basic Metabolic Panel:  Lab 07/13/12 8119  NA 136  K 4.0  CL 100  CO2 26  GLUCOSE 116*  BUN 11  CREATININE 0.79  CALCIUM 9.4  MG --  PHOS --   Liver Function Tests:  Lab  07/13/12 1216  AST 20  ALT 14  ALKPHOS 66  BILITOT 0.3  PROT 7.0  ALBUMIN 3.8   CBC:  Lab 07/14/12 0906 07/13/12 1216  WBC 9.3 17.9*  NEUTROABS -- 15.6*  HGB 12.3 12.6  HCT 36.3 36.9  MCV 90.3 89.6  PLT 255 263     Studies: Mr Lucretia Kern Contrast  07/13/2012  *RADIOLOGY REPORT*  Clinical Data:  Dizziness with blurred vision.  MRI HEAD WITHOUT CONTRAST MRA HEAD WITHOUT CONTRAST  Technique:  Multiplanar, multiecho pulse sequences of the brain and surrounding structures were obtained without intravenous contrast. Angiographic images of the head were obtained using MRA technique without contrast.  Comparison:   None  MRI HEAD  Findings:  There is an acute periventricular white matter lesion affecting the right optic radiation (image 12 series 4, arrow). Its periventricular location, as well as the presence of numerous other white matter lesions, many also periventricular and ovoid, raises the question of acute and chronic MS.  There is no hemorrhage, mass lesion, cortical involvement, hydrocephalus, or extra-axial fluid.  There is slight premature atrophy for age.  Numerous periventricular lesions are seen which do not show restricted diffusion.  Some of these display central brain substance loss or encephalomalacia.  One lesion involves the mammillothalamic tract on the right at the level  of the anterior third ventricle, and location unusual for chronic microvascular ischemic change.  No other brainstem or cerebellar involvement although numerous non cystic periventricular lesions can be seen in both temporal lobes.  The carotid and basilar arteries widely patent.  The orbits appear negative.  There is no midline shift.  Unremarkable pituitary and cerebellar tonsils.  No visible upper cervical lesions.  IMPRESSION: Numerous white matter lesions which are predominately periventricular location suspicious for chronic MS, with a suspected acute demyelinating lesion in the right temporal lobe  periventricular optic radiation.  Mild premature atrophy.  MRA HEAD  Findings: The internal carotid arteries are widely patent.  The basilar artery is widely patent with vertebrals codominant.  There is no intracranial stenosis or aneurysm.  IMPRESSION: Negative exam.   Original Report Authenticated By: Davonna Belling, M.D.    Mr Brain Wo Contrast  07/13/2012  *RADIOLOGY REPORT*  Clinical Data:  Dizziness with blurred vision.  MRI HEAD WITHOUT CONTRAST MRA HEAD WITHOUT CONTRAST  Technique:  Multiplanar, multiecho pulse sequences of the brain and surrounding structures were obtained without intravenous contrast. Angiographic images of the head were obtained using MRA technique without contrast.  Comparison:   None  MRI HEAD  Findings:  There is an acute periventricular white matter lesion affecting the right optic radiation (image 12 series 4, arrow). Its periventricular location, as well as the presence of numerous other white matter lesions, many also periventricular and ovoid, raises the question of acute and chronic MS.  There is no hemorrhage, mass lesion, cortical involvement, hydrocephalus, or extra-axial fluid.  There is slight premature atrophy for age.  Numerous periventricular lesions are seen which do not show restricted diffusion.  Some of these display central brain substance loss or encephalomalacia.  One lesion involves the mammillothalamic tract on the right at the level of the anterior third ventricle, and location unusual for chronic microvascular ischemic change.  No other brainstem or cerebellar involvement although numerous non cystic periventricular lesions can be seen in both temporal lobes.  The carotid and basilar arteries widely patent.  The orbits appear negative.  There is no midline shift.  Unremarkable pituitary and cerebellar tonsils.  No visible upper cervical lesions.  IMPRESSION: Numerous white matter lesions which are predominately periventricular location suspicious for chronic  MS, with a suspected acute demyelinating lesion in the right temporal lobe periventricular optic radiation.  Mild premature atrophy.  MRA HEAD  Findings: The internal carotid arteries are widely patent.  The basilar artery is widely patent with vertebrals codominant.  There is no intracranial stenosis or aneurysm.  IMPRESSION: Negative exam.   Original Report Authenticated By: Davonna Belling, M.D.     Scheduled Meds:    . enoxaparin (LOVENOX) injection  40 mg Subcutaneous Q24H  . methylPREDNISolone (SOLU-MEDROL) injection  1,000 mg Intravenous Daily  . pantoprazole (PROTONIX) IV  40 mg Intravenous Q24H   Continuous Infusions:   Principal Problem:  *Multiple sclerosis    Time spent: 35 min    [facility]  [facility] Pager [phone]. If 8PM-8AM, please contact night-coverage at [email or web address], password [facility] 07/15/2012, 2:18 PM  LOS: 2 days

## 2012-07-15 NOTE — Care Management Note (Unsigned)
    Page 1 of 1   07/15/2012     3:20:20 PM   CARE MANAGEMENT NOTE 07/15/2012  Patient:  Suzanne Robbins, Suzanne Robbins   Account Number:  [id number]  Date Initiated:  07/15/2012  Documentation initiated by:  [facility]  Subjective/Objective Assessment:   Admitted with diplopia, n/v, dizziness.Lives with daughter.     Action/Plan:   return home with [facility] and home IV solumedrol   Anticipated DC Date:  07/16/2012   Anticipated DC Plan:  HOME W HOME HEALTH SERVICES      DC Planning Services  CM consult      Choice offered to / List presented to:  C-1 Patient        HH arranged  HH-1 RN      [facility]   Status of service:  In process, will continue to follow Medicare Important Message given?   (If response is "NO", the following Medicare IM given date fields will be blank) Date Medicare IM given:   Date Additional Medicare IM given:    Discharge Disposition:    Per UR Regulation:  Reviewed for med. necessity/level of care/duration of stay  If discussed at Long Length of Stay Meetings, dates discussed:    Comments:  07/15/12 Spoke with patient about having IV solumedrol at home with [facility] visits. Patient is agreeable to learning to administer IV solumedrol and plans to have her mother or her daughter who is living with her and attends college also learn to give the solumedrol. Patient selected [facility]. Contacted Donna at [facility], they will be able to provide the Iv solumedrol and HHRN visits. CMA checked coverage for IV solumedrol and was told that it is covered as DME provided by the [facility] agency. Will continue to follow for d/c needs. Jacquelynn Cree RN, BSN, CCM

## 2012-07-16 DIAGNOSIS — G379 Demyelinating disease of central nervous system, unspecified: Secondary | ICD-10-CM

## 2012-07-16 MED ORDER — ZOLPIDEM TARTRATE 5 MG PO TABS: 5.0000 mg | ORAL_TABLET | Freq: Every evening | ORAL | Status: DC | PRN

## 2012-07-16 MED ORDER — SODIUM CHLORIDE 0.9 % IV SOLN: 1000.0000 mg | Freq: Every day | INTRAVENOUS | Status: AC

## 2012-07-16 NOTE — Progress Notes (Addendum)
Subjective: Patient reports that her vision has improved further.  Has her glasses on and reports only has difficulty on looking to the right.  Had some mild dfficulty sleeping last evening but otherwise has tolerated the steroids well.    Objective: Current vital signs: BP 111/63  Pulse 82  Temp 98 F (36.7 C) (Oral)  Resp 16  Ht 5\' 3"  (1.6 m)  Wt 74.844 kg (165 lb)  BMI 29.23 kg/m2  SpO2 97% Vital signs in last 24 hours: Temp:  [97.9 F (36.6 C)-98 F (36.7 C)] 98 F (36.7 C) (11/09 0525) Pulse Rate:  [70-82] 82  (11/09 0525) Resp:  [16-18] 16  (11/09 0525) BP: (111-119)/(63-73) 111/63 mmHg (11/09 0525) SpO2:  [96 %-99 %] 97 % (11/09 0525)  Intake/Output from previous day:   Intake/Output this shift:   Nutritional status: General  Neurologic Exam: Mental Status:  Alert, oriented, thought content appropriate. Speech fluent without evidence of aphasia. Able to follow 3 step commands without difficulty.  Cranial Nerves:  II: visual fields grossly normal, pupils equal, round, reactive to light and accommodation  III,IV, VI: Nystagmus on right and left lateral gaze, with upward gaze right eye moves up and inward, downward gaze is normal today.  V,VII: smile symmetric, facial light touch sensation normal bilaterally  VIII: hearing normal bilaterally  IX,X: gag reflex present  XI: trapezius strength/neck flexion strength normal bilaterally  XII: tongue strength normal  Motor:  Right : Upper extremity 5/5          Left: Upper extremity 5/5   Lower extremity 5/5       Lower extremity 5/5  Tone and bulk:normal tone throughout; no atrophy noted  Sensory: Pinprick and light touch intact throughout, bilaterally  Deep Tendon Reflexes: 3+ UE, Knees bilaterally, 2+ right ankle, absent left ankle jerk  Plantars:  Right: downgoing      Left: downgoing  Cerebellar:  normal finger-to-nose and heel to shin. Gait cautious but narrow based and with symmetric arm swing    Lab  Results: Basic Metabolic Panel:  Lab 07/13/12 1610  NA 136  K 4.0  CL 100  CO2 26  GLUCOSE 116*  BUN 11  CREATININE 0.79  CALCIUM 9.4  MG --  PHOS --    Liver Function Tests:  Lab 07/13/12 1216  AST 20  ALT 14  ALKPHOS 66  BILITOT 0.3  PROT 7.0  ALBUMIN 3.8   No results found for this basename: LIPASE:5,AMYLASE:5 in the last 168 hours No results found for this basename: AMMONIA:3 in the last 168 hours  CBC:  Lab 07/14/12 0906 07/13/12 1216  WBC 9.3 17.9*  NEUTROABS -- 15.6*  HGB 12.3 12.6  HCT 36.3 36.9  MCV 90.3 89.6  PLT 255 263    Cardiac Enzymes: No results found for this basename: CKTOTAL:5,CKMB:5,CKMBINDEX:5,TROPONINI:5 in the last 168 hours  Lipid Panel: No results found for this basename: CHOL:5,TRIG:5,HDL:5,CHOLHDL:5,VLDL:5,LDLCALC:5 in the last 168 hours  CBG: No results found for this basename: GLUCAP:5 in the last 168 hours  Microbiology: Results for orders placed during the hospital encounter of 07/13/12  CSF CULTURE     Status: Normal (Preliminary result)   Collection Time   07/14/12  8:37 AM      Component Value Range Status Comment   Specimen Description CSF   Final    Special Requests 8.0ML   Final    Gram Stain     Final    Value: CYTOSPIN WBC PRESENT, PREDOMINANTLY MONONUCLEAR  NO ORGANISMS SEEN     Performed at [facility]   Culture NO GROWTH 1 DAY   Final    Report Status PENDING   Incomplete   GRAM STAIN     Status: Normal   Collection Time   07/14/12  8:37 AM      Component Value Range Status Comment   Specimen Description CSF   Final    Special Requests 8CC   Final    Gram Stain     Final    Value: CYTOSPIN CSF SLIDE     WBC PRESENT, PREDOMINANTLY MONONUCLEAR     NO ORGANISMS SEEN   Report Status 07/14/2012 FINAL   Final   FUNGUS CULTURE W SMEAR     Status: Normal (Preliminary result)   Collection Time   07/14/12  8:37 AM      Component Value Range Status Comment   Specimen Description CSF   Final     Special Requests 8.0ML   Final    Fungal Smear NO YEAST OR FUNGAL ELEMENTS SEEN   Final    Culture CULTURE IN PROGRESS FOR FOUR WEEKS   Final    Report Status PENDING   Incomplete     Coagulation Studies:  Basename 07/13/12 2050  LABPROT 13.8  INR 1.07    Imaging: No results found.  Medications:  I have reviewed the patient's current medications. Scheduled:   . enoxaparin (LOVENOX) injection  40 mg Subcutaneous Q24H  . methylPREDNISolone (SOLU-MEDROL) injection  1,000 mg Intravenous Daily  . pantoprazole (PROTONIX) IV  40 mg Intravenous Q24H    Assessment/Plan:  Patient Active Hospital Problem List: Multiple sclerosis (07/13/2012)   Assessment: LP results show a negative cryptococcal antigen and lyme titer.  ANA and ESR are normal as well.  Specific MS titers are pending.     Plan:  1.  Today is day #2 of 5 days of steroids.  Patient tolerating well and may continue other doses as an outpatient.  Will not require a taper.  2.  To follow up with neurology as an outpatient.  If difficulty with appointment has my card.  Will obtain remaining LP results as an outpatient as well.    Case discussed with Dr. Sharl Ma   LOS: 3 days   Thana Farr, MD [facility] [phone] 07/16/2012  11:38 AM

## 2012-07-16 NOTE — Progress Notes (Signed)
Patient discharge this afternoon with the [facility] up [facility] for [facility] medication, discharge instructions given and all questions answered. Contacted [facility] service for whether new iv need to be restarted., was told not to worry about it since they will start the iv when patient gets home.Did as they said.

## 2012-07-16 NOTE — Discharge Summary (Signed)
Physician Discharge Summary  Betsabe J Budden ZOX:[id number] DOB: 12/22/1970 DOA: 07/13/2012  PCP: Horald Pollen., PA  Admit date: 07/13/2012 Discharge date: 07/16/2012  Time spent: 45  minutes  Recommendations for Outpatient Follow-up:  1. Follow up [facility] associates in one week  Discharge Diagnoses:  Principal Problem:  *Multiple sclerosis   Discharge Condition: Stable  Diet recommendation: regualr  Filed Weights   07/13/12 2047  Weight: 74.844 kg (165 lb)    History of present illness:  41 y.o. female with history of vague paresthesia for a few months, who presented to the Ed today with progressive worsening diplopia, vertigo, nausea and vomiting - worsening for the past 3 days. She has also having difficulty walking as she has to hold on to the walls not to fall. She denies any fever chills or headache. MRI with demyelinating lesions concerning for MS.    Hospital Course:  Demyelinating disease, most likely MS: Patient had LP done by neurology, LP results show a negative cryptococcal antigen and lyme titer. ANA and ESR are normal as well. Specific MS titers are pending. Patient has been started on Solumedrol 1 gram per day for total five doses. She has received two doses in the hospital and will require three more doses at home. [facility] has been set up to provide three days of daily solumedrol. Patient feels somewhat better, and will follow with neurology as outpatient.  Leukocytosis:  Resolved, WBC was 17.9 on admission and now back to normal. Patient denies any symptoms of fever, cough, dysuria, shortness of breath before coming to the hospital. She completed ten day course of antibiotics with bactrim three days before she came to the hospital for ? Infection as per patient. She did not have signs or symptoms of infection. At this time leukocytosis has resolved.    Procedures:  Lumbar puncture  Consultations:  Neurology  Discharge Exam: Filed  Vitals:   07/15/12 0557 07/15/12 1400 07/15/12 2125 07/16/12 0525  BP: 105/54 116/73 119/69 111/63  Pulse: 74 70 76 82  Temp: 98.2 F (36.8 C) 98 F (36.7 C) 97.9 F (36.6 C) 98 F (36.7 C)  TempSrc: Oral Oral Oral Oral  Resp: 17 18 16 16   Height:      Weight:      SpO2: 98% 99% 96% 97%    General: Appear in no acute distress Cardiovascular: S1S2 RRR Respiratory: Clear bilaterally Ext : No edema  Discharge Instructions  Discharge Orders    Future Orders Please Complete By Expires   Diet - low sodium heart healthy      Increase activity slowly      Discharge instructions      Comments:   Follow [facility] as outpatient       Medication List     As of 07/16/2012 12:25 PM    TAKE these medications         estrogens (conjugated) 0.9 MG tablet   Commonly known as: PREMARIN   Take 0.9 mg by mouth daily. Take daily for 21 days then do not take for 7 days.      fluticasone 27.5 MCG/SPRAY nasal spray   Commonly known as: VERAMYST   Place 2 sprays into the nose daily.      ibuprofen 200 MG tablet   Commonly known as: ADVIL,MOTRIN   Take 400 mg by mouth every 6 (six) hours as needed. For pain      meclizine 25 MG tablet   Commonly known as: ANTIVERT  Take 25 mg by mouth 3 (three) times daily as needed. For dizziness      sodium chloride 0.9 % SOLN 50 mL with methylPREDNISolone sodium succinate 1000 MG SOLR 1,000 mg   Inject 1,000 mg into the vein daily.   Start taking on: 07/17/2012      VITAMIN C PO   Take 1 tablet by mouth daily.      zolpidem 5 MG tablet   Commonly known as: AMBIEN   Take 1 tablet (5 mg total) by mouth at bedtime as needed for sleep (insomnia).           Follow-up Information    Follow up with SETHI,PRAMODKUMAR P, MD. In 1 week.   Contact information:   [address], [address] [facility] [place] [address] [phone]           The results of significant diagnostics from this hospitalization  (including imaging, microbiology, ancillary and laboratory) are listed below for reference.    Significant Diagnostic Studies: Mr Shirlee Latch ZH Contrast  07/13/2012  *RADIOLOGY REPORT*  Clinical Data:  Dizziness with blurred vision.  MRI HEAD WITHOUT CONTRAST MRA HEAD WITHOUT CONTRAST  Technique:  Multiplanar, multiecho pulse sequences of the brain and surrounding structures were obtained without intravenous contrast. Angiographic images of the head were obtained using MRA technique without contrast.  Comparison:   None  MRI HEAD  Findings:  There is an acute periventricular white matter lesion affecting the right optic radiation (image 12 series 4, arrow). Its periventricular location, as well as the presence of numerous other white matter lesions, many also periventricular and ovoid, raises the question of acute and chronic MS.  There is no hemorrhage, mass lesion, cortical involvement, hydrocephalus, or extra-axial fluid.  There is slight premature atrophy for age.  Numerous periventricular lesions are seen which do not show restricted diffusion.  Some of these display central brain substance loss or encephalomalacia.  One lesion involves the mammillothalamic tract on the right at the level of the anterior third ventricle, and location unusual for chronic microvascular ischemic change.  No other brainstem or cerebellar involvement although numerous non cystic periventricular lesions can be seen in both temporal lobes.  The carotid and basilar arteries widely patent.  The orbits appear negative.  There is no midline shift.  Unremarkable pituitary and cerebellar tonsils.  No visible upper cervical lesions.  IMPRESSION: Numerous white matter lesions which are predominately periventricular location suspicious for chronic MS, with a suspected acute demyelinating lesion in the right temporal lobe periventricular optic radiation.  Mild premature atrophy.  MRA HEAD  Findings: The internal carotid arteries are widely  patent.  The basilar artery is widely patent with vertebrals codominant.  There is no intracranial stenosis or aneurysm.  IMPRESSION: Negative exam.   Original Report Authenticated By: Davonna Belling, M.D.    Mr Brain Wo Contrast  07/13/2012  *RADIOLOGY REPORT*  Clinical Data:  Dizziness with blurred vision.  MRI HEAD WITHOUT CONTRAST MRA HEAD WITHOUT CONTRAST  Technique:  Multiplanar, multiecho pulse sequences of the brain and surrounding structures were obtained without intravenous contrast. Angiographic images of the head were obtained using MRA technique without contrast.  Comparison:   None  MRI HEAD  Findings:  There is an acute periventricular white matter lesion affecting the right optic radiation (image 12 series 4, arrow). Its periventricular location, as well as the presence of numerous other white matter lesions, many also periventricular and ovoid, raises the question of acute and chronic  MS.  There is no hemorrhage, mass lesion, cortical involvement, hydrocephalus, or extra-axial fluid.  There is slight premature atrophy for age.  Numerous periventricular lesions are seen which do not show restricted diffusion.  Some of these display central brain substance loss or encephalomalacia.  One lesion involves the mammillothalamic tract on the right at the level of the anterior third ventricle, and location unusual for chronic microvascular ischemic change.  No other brainstem or cerebellar involvement although numerous non cystic periventricular lesions can be seen in both temporal lobes.  The carotid and basilar arteries widely patent.  The orbits appear negative.  There is no midline shift.  Unremarkable pituitary and cerebellar tonsils.  No visible upper cervical lesions.  IMPRESSION: Numerous white matter lesions which are predominately periventricular location suspicious for chronic MS, with a suspected acute demyelinating lesion in the right temporal lobe periventricular optic radiation.  Mild  premature atrophy.  MRA HEAD  Findings: The internal carotid arteries are widely patent.  The basilar artery is widely patent with vertebrals codominant.  There is no intracranial stenosis or aneurysm.  IMPRESSION: Negative exam.   Original Report Authenticated By: Davonna Belling, M.D.     Microbiology: Recent Results (from the past 240 hour(s))  CSF CULTURE     Status: Normal (Preliminary result)   Collection Time   07/14/12  8:37 AM      Component Value Range Status Comment   Specimen Description CSF   Final    Special Requests 8.0ML   Final    Gram Stain     Final    Value: CYTOSPIN WBC PRESENT, PREDOMINANTLY MONONUCLEAR     NO ORGANISMS SEEN     Performed at [facility]   Culture NO GROWTH 1 DAY   Final    Report Status PENDING   Incomplete   GRAM STAIN     Status: Normal   Collection Time   07/14/12  8:37 AM      Component Value Range Status Comment   Specimen Description CSF   Final    Special Requests 8CC   Final    Gram Stain     Final    Value: CYTOSPIN CSF SLIDE     WBC PRESENT, PREDOMINANTLY MONONUCLEAR     NO ORGANISMS SEEN   Report Status 07/14/2012 FINAL   Final   FUNGUS CULTURE W SMEAR     Status: Normal (Preliminary result)   Collection Time   07/14/12  8:37 AM      Component Value Range Status Comment   Specimen Description CSF   Final    Special Requests 8.0ML   Final    Fungal Smear NO YEAST OR FUNGAL ELEMENTS SEEN   Final    Culture CULTURE IN PROGRESS FOR FOUR WEEKS   Final    Report Status PENDING   Incomplete      Labs: Basic Metabolic Panel:  Lab 07/13/12 3086  NA 136  K 4.0  CL 100  CO2 26  GLUCOSE 116*  BUN 11  CREATININE 0.79  CALCIUM 9.4  MG --  PHOS --   Liver Function Tests:  Lab 07/13/12 1216  AST 20  ALT 14  ALKPHOS 66  BILITOT 0.3  PROT 7.0  ALBUMIN 3.8   No results found for this basename: LIPASE:5,AMYLASE:5 in the last 168 hours No results found for this basename: AMMONIA:5 in the last 168 hours CBC:  Lab  07/14/12 0906 07/13/12 1216  WBC 9.3 17.9*  NEUTROABS -- 15.6*  HGB 12.3 12.6  HCT 36.3 36.9  MCV 90.3 89.6  PLT 255 263       Signed:  Lorae Roig S  [facility] 07/16/2012, 12:25 PM

## 2012-07-17 LAB — CSF CULTURE W GRAM STAIN: Culture: NO GROWTH

## 2012-07-18 LAB — ACETYLCHOLINE RECEPTOR, BINDING: Acetylcholine Receptor Ab: <0.3 nmol/L (ref <=0.30)

## 2012-07-19 LAB — HEAVY METALS SCREEN, URINE: Arsenic, 24H Ur: <2 mcg/L (ref <81)

## 2012-08-11 LAB — FUNGUS CULTURE W SMEAR: Fungal Smear: NONE SEEN

## 2012-08-25 ENCOUNTER — Other Ambulatory Visit: Payer: Self-pay | Admitting: Neurology

## 2012-09-06 ENCOUNTER — Encounter (HOSPITAL_COMMUNITY)
Admission: RE | Admit: 2012-09-06 | Discharge: 2012-09-06 | Disposition: A | Discharge status: Home / Self Care | Payer: BC Managed Care – PPO | Source: Ambulatory Visit | Attending: Neurology | Admitting: Neurology

## 2012-09-06 ENCOUNTER — Encounter (HOSPITAL_COMMUNITY): Payer: Self-pay

## 2012-09-06 DIAGNOSIS — G35 Multiple sclerosis: Secondary | ICD-10-CM | POA: Insufficient documentation

## 2012-09-06 HISTORY — DX: Multiple sclerosis: G35

## 2012-09-06 MED ORDER — ACETAMINOPHEN 500 MG PO TABS
1000.0000 mg | ORAL_TABLET | ORAL | Status: DC
  Administered 2012-09-06: 1000 mg via ORAL
  Filled 2012-09-06: qty 2

## 2012-09-06 MED ORDER — LORATADINE 10 MG PO TABS
10.0000 mg | ORAL_TABLET | ORAL | Status: DC
  Administered 2012-09-06: 10 mg via ORAL
  Filled 2012-09-06: qty 1

## 2012-09-06 MED ORDER — SODIUM CHLORIDE 0.9 % IV SOLN
INTRAVENOUS | Status: DC
  Administered 2012-09-06: 10:00:00 via INTRAVENOUS

## 2012-09-06 MED ORDER — SODIUM CHLORIDE 0.9 % IV SOLN
300.0000 mg | INTRAVENOUS | Status: DC
  Administered 2012-09-06: 300 mg via INTRAVENOUS
  Filled 2012-09-06: qty 15

## 2012-09-13 ENCOUNTER — Encounter (HOSPITAL_COMMUNITY): Payer: BC Managed Care – PPO

## 2012-10-04 ENCOUNTER — Encounter (HOSPITAL_COMMUNITY): Payer: Self-pay

## 2012-10-04 ENCOUNTER — Encounter (HOSPITAL_COMMUNITY)
Admission: RE | Admit: 2012-10-04 | Discharge: 2012-10-04 | Disposition: A | Discharge status: Home / Self Care | Payer: BC Managed Care – PPO | Source: Ambulatory Visit | Attending: Neurology | Admitting: Neurology

## 2012-10-04 DIAGNOSIS — G35 Multiple sclerosis: Secondary | ICD-10-CM | POA: Insufficient documentation

## 2012-10-04 MED ORDER — SODIUM CHLORIDE 0.9 % IV SOLN
INTRAVENOUS | Status: DC
  Administered 2012-10-04: 15:00:00 via INTRAVENOUS

## 2012-10-04 MED ORDER — ACETAMINOPHEN 500 MG PO TABS
1000.0000 mg | ORAL_TABLET | ORAL | Status: DC
  Administered 2012-10-04: 1000 mg via ORAL
  Filled 2012-10-04: qty 2

## 2012-10-04 MED ORDER — SODIUM CHLORIDE 0.9 % IV SOLN
300.0000 mg | INTRAVENOUS | Status: DC
  Administered 2012-10-04: 300 mg via INTRAVENOUS
  Filled 2012-10-04: qty 15

## 2012-10-04 MED ORDER — LORATADINE 10 MG PO TABS
10.0000 mg | ORAL_TABLET | ORAL | Status: DC
  Administered 2012-10-04: 10 mg via ORAL
  Filled 2012-10-04: qty 1

## 2012-10-25 ENCOUNTER — Encounter: Payer: Self-pay | Admitting: Neurology

## 2012-11-01 ENCOUNTER — Encounter (HOSPITAL_COMMUNITY)
Admission: RE | Admit: 2012-11-01 | Discharge: 2012-11-01 | Disposition: A | Discharge status: Home / Self Care | Payer: BC Managed Care – PPO | Source: Ambulatory Visit | Attending: Neurology | Admitting: Neurology

## 2012-11-01 ENCOUNTER — Encounter (HOSPITAL_COMMUNITY): Payer: Self-pay

## 2012-11-01 DIAGNOSIS — G35 Multiple sclerosis: Secondary | ICD-10-CM | POA: Insufficient documentation

## 2012-11-01 MED ORDER — ACETAMINOPHEN 500 MG PO TABS
1000.0000 mg | ORAL_TABLET | ORAL | Status: AC
  Administered 2012-11-01: 1000 mg via ORAL
  Filled 2012-11-01: qty 2

## 2012-11-01 MED ORDER — SODIUM CHLORIDE 0.9 % IV SOLN
INTRAVENOUS | Status: AC
  Administered 2012-11-01: 15:00:00 via INTRAVENOUS

## 2012-11-01 MED ORDER — LORATADINE 10 MG PO TABS
10.0000 mg | ORAL_TABLET | ORAL | Status: AC
  Administered 2012-11-01: 10 mg via ORAL
  Filled 2012-11-01: qty 1

## 2012-11-01 MED ORDER — SODIUM CHLORIDE 0.9 % IV SOLN
300.0000 mg | INTRAVENOUS | Status: AC
  Administered 2012-11-01: 300 mg via INTRAVENOUS
  Filled 2012-11-01: qty 15

## 2012-11-25 ENCOUNTER — Telehealth: Payer: Self-pay | Admitting: Neurology

## 2012-11-25 NOTE — Telephone Encounter (Signed)
Faxed the paper order to infusion center

## 2012-11-25 NOTE — Telephone Encounter (Signed)
Order faxed to [facility] [phone].  Spoke to [place], [place] that orders on there way. ssy

## 2012-11-25 NOTE — Telephone Encounter (Signed)
Calling for tysabri orders

## 2012-11-28 ENCOUNTER — Other Ambulatory Visit (HOSPITAL_COMMUNITY): Payer: Self-pay | Admitting: Neurology

## 2012-11-29 ENCOUNTER — Encounter (HOSPITAL_COMMUNITY): Payer: Self-pay

## 2012-11-29 ENCOUNTER — Encounter (HOSPITAL_COMMUNITY)
Admission: RE | Admit: 2012-11-29 | Discharge: 2012-11-29 | Disposition: A | Discharge status: Home / Self Care | Payer: BC Managed Care – PPO | Source: Ambulatory Visit | Attending: Neurology | Admitting: Neurology

## 2012-11-29 DIAGNOSIS — G35 Multiple sclerosis: Secondary | ICD-10-CM | POA: Insufficient documentation

## 2012-11-29 MED ORDER — SODIUM CHLORIDE 0.9 % IV SOLN
300.0000 mg | INTRAVENOUS | Status: DC
  Administered 2012-11-29: 300 mg via INTRAVENOUS
  Filled 2012-11-29: qty 15

## 2012-11-29 MED ORDER — LORATADINE 10 MG PO TABS
10.0000 mg | ORAL_TABLET | ORAL | Status: DC
  Administered 2012-11-29: 10 mg via ORAL
  Filled 2012-11-29: qty 1

## 2012-11-29 MED ORDER — SODIUM CHLORIDE 0.9 % IV SOLN
INTRAVENOUS | Status: DC
  Administered 2012-11-29: 15:00:00 via INTRAVENOUS

## 2012-11-29 MED ORDER — ACETAMINOPHEN 500 MG PO TABS
1000.0000 mg | ORAL_TABLET | ORAL | Status: DC
  Administered 2012-11-29: 1000 mg via ORAL
  Filled 2012-11-29: qty 2

## 2012-12-02 ENCOUNTER — Encounter: Payer: Self-pay | Admitting: Neurology

## 2012-12-02 ENCOUNTER — Ambulatory Visit (INDEPENDENT_AMBULATORY_CARE_PROVIDER_SITE_OTHER): Payer: Self-pay | Admitting: Neurology

## 2012-12-02 VITALS — BP 126/78 | HR 86 | Ht 63.0 in | Wt 183.0 lb

## 2012-12-02 DIAGNOSIS — G35 Multiple sclerosis: Secondary | ICD-10-CM

## 2012-12-02 DIAGNOSIS — C55 Malignant neoplasm of uterus, part unspecified: Secondary | ICD-10-CM

## 2012-12-02 MED ORDER — SERTRALINE HCL 50 MG PO TABS: 50.0000 mg | ORAL_TABLET | Freq: Every day | ORAL | Status: DC

## 2012-12-02 NOTE — Progress Notes (Signed)
History of Present Illness  HPI: Suzanne Robbins is a 42 years old right-handed Caucasian female, followup for  Multiple sclerosis  In August 2013, she noticed numbness of her left foot, intermittent, faded away in 2 weeks  In September 2013, she noticed left arm numbness, itching underneath her skin, lasting for 2 weeks In October 2013, she began to notice unbalanced gait, dizziness, progressively worse, woke up one morning noticed double vision, worsening balance difficulty, binocular diplopia, spinning sensation, profound gait difficulty, fatigue, nausea as, as  She was admitted to [facility], July 13 2012  MRI brain [facility]) showed numerous white matter lesions which are predominately periventricular location suspicious for chronic MS, with asuspected acute demyelinating lesion in the right temporal lobe periventricular optic radiation.  Mild premature atrophy. No contrast was given. MRA brain was normal.  [facility], Jul 27, 2012 MRI of the brain showed Multiple chronic periventricular and subcortical chronic demyelinating plaques.There are 3 enhancing lesions likely acute demyelinating plaques in the right periatrial, left posterior frontal and left occipital regions   MRI cervical spine showed Multiple chronic demyelinating plaques at C2-3, C3, C4, C5-6, and C7. No contrast enhancement Laboratory evaluation showed normal ESR 10, CMP, CBC, negative Lyme titer, acetylcholine receptor antibody, ANA, TSH,  CSF, WBC 3, RBC 12, negative cryptococcal antigen, 5 oligoclonal bands, total protein 29, glucose 65 JC virus antibody was negative in Jul 19, 2102.  VEP 07/25/2012: was within normal limits bilaterally. No evidence of conduction slowing was seen within the anterior visual pathways on either side by today's evaluation.  diagnosis of relapsing remitting multiple sclerosis, she received IV steroid   March 28th 2014,   She had Tysarbri since 12 2013, had 4 treatment so far, no side  effect, at [facility], 3pm, takes 2-3 hours, infusion took one hour, observe for one hour after infusion, symptoms have improved, right leg has heat inside, feeling sun burn, no gait difficulty, no incontinence, no visual difficulty. 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 She lives with her daughter 72, driving no difficulty.  Review of Systems  Out of a complete 14 system review, the patient complains of only the following symptoms, and all other reviewed systems are negative.   Constitutional:   N/A Cardiovascular:  N/A Ear/Nose/Throat:  N/A Skin: N/A Eyes: N/A Respiratory: N/A Gastroitestinal: N/A    Hematology/Lymphatic:  N/A Musculoskeletal:N/A Endocrine:  N/A Neurological: right leg numbness Psychiatric:    N/A   Physical Exam  Neck: supple no carotid bruits Respiratory: clear to auscultation bilaterally Cardiovascular: regular rate rhythm  Neurologic Exam  Mental Status:  pleasant  middle-aged female,awake, alert, cooperative to history, talking, and casual conversation. Cranial Nerves: CN II-XII pupils were equal round reactive to light.  Fundi were sharp bilaterally.   left afferent pupillary defect,Extraocular movements were full.  Visual fields were full on confrontational test.  Facial sensation and strength were normal.  Hearing was intact to finger rubbing bilaterally.  Uvula tongue were midline.  Head turning and shoulder shrugging were normal and symmetric.  Tongue protrusion into the cheeks strength were normal.  Motor: Normal tone, bulk, and strength. Sensory: Normal to light touch, pinprick, proprioception, and vibratory sensation. Coordination: Normal finger-to-nose, heel-to-shin.  There was no dysmetria noticed. Gait and Station: narrow based, steady gait, could not perform tiptoe, tandem walking, Romberg sign: Negative Reflexes: Deep tendon reflexes: Biceps: 2/2, Brachioradialis: 2/2, Triceps: 2/2, Pateller: 3/3, Achilles: 2/2.  Plantar responses  are flexor.   Assessment and Plan:  42 years old [place] American female, with relapsing remitting multiple sclerosis, active enhancing lesions involving brain,  and cervical spines. Jc-ab negative.  1, Tysarbri infusion 2. RTC in 4-6 months, need to check JC virus antibody, repeat MRI of the brain in November 2014.

## 2012-12-02 NOTE — Telephone Encounter (Signed)
Error

## 2012-12-05 ENCOUNTER — Ambulatory Visit: Payer: Self-pay | Admitting: Neurology

## 2012-12-27 ENCOUNTER — Encounter (HOSPITAL_COMMUNITY)
Admission: RE | Admit: 2012-12-27 | Discharge: 2012-12-27 | Disposition: A | Discharge status: Home / Self Care | Payer: BC Managed Care – PPO | Source: Ambulatory Visit | Attending: Neurology | Admitting: Neurology

## 2012-12-27 ENCOUNTER — Encounter (HOSPITAL_COMMUNITY): Payer: Self-pay

## 2012-12-27 DIAGNOSIS — G35 Multiple sclerosis: Secondary | ICD-10-CM | POA: Insufficient documentation

## 2012-12-27 MED ORDER — LORATADINE 10 MG PO TABS
10.0000 mg | ORAL_TABLET | ORAL | Status: DC
  Administered 2012-12-27: 10 mg via ORAL
  Filled 2012-12-27: qty 1

## 2012-12-27 MED ORDER — SODIUM CHLORIDE 0.9 % IV SOLN
INTRAVENOUS | Status: DC
Start: 2012-12-27 — End: 2012-12-28
  Administered 2012-12-27: 20 mL/h via INTRAVENOUS

## 2012-12-27 MED ORDER — ACETAMINOPHEN 500 MG PO TABS
1000.0000 mg | ORAL_TABLET | ORAL | Status: DC
  Administered 2012-12-27: 1000 mg via ORAL
  Filled 2012-12-27: qty 2

## 2012-12-27 MED ORDER — SODIUM CHLORIDE 0.9 % IV SOLN
300.0000 mg | INTRAVENOUS | Status: DC
  Administered 2012-12-27: 300 mg via INTRAVENOUS
  Filled 2012-12-27: qty 15

## 2012-12-28 ENCOUNTER — Telehealth: Payer: Self-pay | Admitting: *Deleted

## 2013-01-24 ENCOUNTER — Encounter (HOSPITAL_COMMUNITY)
Admission: RE | Admit: 2013-01-24 | Discharge: 2013-01-24 | Disposition: A | Discharge status: Home / Self Care | Payer: BC Managed Care – PPO | Source: Ambulatory Visit | Attending: Neurology | Admitting: Neurology

## 2013-01-24 ENCOUNTER — Encounter (HOSPITAL_COMMUNITY): Payer: Self-pay

## 2013-01-24 DIAGNOSIS — G35 Multiple sclerosis: Secondary | ICD-10-CM | POA: Insufficient documentation

## 2013-01-24 MED ORDER — SODIUM CHLORIDE 0.9 % IV SOLN
300.0000 mg | INTRAVENOUS | Status: DC
  Administered 2013-01-24: 300 mg via INTRAVENOUS
  Filled 2013-01-24: qty 15

## 2013-01-24 MED ORDER — LORATADINE 10 MG PO TABS
10.0000 mg | ORAL_TABLET | ORAL | Status: DC
  Administered 2013-01-24: 10 mg via ORAL
  Filled 2013-01-24: qty 1

## 2013-01-24 MED ORDER — SODIUM CHLORIDE 0.9 % IV SOLN
INTRAVENOUS | Status: DC
  Administered 2013-01-24: 14:00:00 via INTRAVENOUS

## 2013-01-24 MED ORDER — ACETAMINOPHEN 500 MG PO TABS
1000.0000 mg | ORAL_TABLET | ORAL | Status: DC
  Administered 2013-01-24: 1000 mg via ORAL
  Filled 2013-01-24: qty 2

## 2013-02-21 ENCOUNTER — Encounter (HOSPITAL_COMMUNITY): Payer: Self-pay

## 2013-02-21 ENCOUNTER — Encounter (HOSPITAL_COMMUNITY)
Admission: RE | Admit: 2013-02-21 | Discharge: 2013-02-21 | Disposition: A | Discharge status: Home / Self Care | Payer: BC Managed Care – PPO | Source: Ambulatory Visit | Attending: Neurology | Admitting: Neurology

## 2013-02-21 DIAGNOSIS — G35 Multiple sclerosis: Secondary | ICD-10-CM | POA: Insufficient documentation

## 2013-02-21 MED ORDER — ACETAMINOPHEN 500 MG PO TABS
1000.0000 mg | ORAL_TABLET | ORAL | Status: AC
  Administered 2013-02-21: 1000 mg via ORAL
  Filled 2013-02-21: qty 2

## 2013-02-21 MED ORDER — SODIUM CHLORIDE 0.9 % IV SOLN
INTRAVENOUS | Status: AC
  Administered 2013-02-21: 14:00:00 via INTRAVENOUS

## 2013-02-21 MED ORDER — SODIUM CHLORIDE 0.9 % IV SOLN
300.0000 mg | INTRAVENOUS | Status: AC
  Administered 2013-02-21: 300 mg via INTRAVENOUS
  Filled 2013-02-21: qty 15

## 2013-02-21 MED ORDER — LORATADINE 10 MG PO TABS
10.0000 mg | ORAL_TABLET | ORAL | Status: AC
  Administered 2013-02-21: 10 mg via ORAL
  Filled 2013-02-21: qty 1

## 2013-02-21 NOTE — Progress Notes (Signed)
Infusion complete at 1535. Patient states she is unable to stay for full 60 minute observation. Left at 1605 (30 minutes observation). Instructed to call Dr. Zannie Cove office or 911 for problems and concerns once discharged.

## 2013-03-15 ENCOUNTER — Other Ambulatory Visit: Payer: Self-pay | Admitting: Neurology

## 2013-03-15 ENCOUNTER — Telehealth: Payer: Self-pay | Admitting: Neurology

## 2013-03-15 DIAGNOSIS — G43909 Migraine, unspecified, not intractable, without status migrainosus: Secondary | ICD-10-CM

## 2013-03-15 NOTE — Telephone Encounter (Signed)
[facility] needs order for Tysabri put in Epic, patient's appointment 03-21-13.

## 2013-03-21 ENCOUNTER — Encounter (HOSPITAL_COMMUNITY)
Admission: RE | Admit: 2013-03-21 | Discharge: 2013-03-21 | Disposition: A | Discharge status: Home / Self Care | Payer: BC Managed Care – PPO | Source: Ambulatory Visit | Attending: Neurology | Admitting: Neurology

## 2013-03-21 ENCOUNTER — Encounter (HOSPITAL_COMMUNITY): Payer: Self-pay

## 2013-03-21 VITALS — BP 110/72 | HR 69 | Temp 97.0°F | Resp 18

## 2013-03-21 DIAGNOSIS — G43909 Migraine, unspecified, not intractable, without status migrainosus: Secondary | ICD-10-CM | POA: Insufficient documentation

## 2013-03-21 MED ORDER — ACETAMINOPHEN 500 MG PO TABS
1000.0000 mg | ORAL_TABLET | ORAL | Status: DC
  Administered 2013-03-21: 1000 mg via ORAL
  Filled 2013-03-21: qty 2

## 2013-03-21 MED ORDER — LORATADINE 10 MG PO TABS
10.0000 mg | ORAL_TABLET | ORAL | Status: DC
  Administered 2013-03-21: 10 mg via ORAL
  Filled 2013-03-21: qty 1

## 2013-03-21 MED ORDER — SODIUM CHLORIDE 0.9 % IV SOLN
INTRAVENOUS | Status: DC
  Administered 2013-03-21: 14:00:00 via INTRAVENOUS

## 2013-03-21 MED ORDER — SODIUM CHLORIDE 0.9 % IV SOLN
300.0000 mg | INTRAVENOUS | Status: DC
  Administered 2013-03-21: 300 mg via INTRAVENOUS
  Filled 2013-03-21: qty 15

## 2013-03-21 NOTE — Progress Notes (Signed)
Patient stayed for 40 minute observation. Stated she could not stay for full 60 minutes. Instructed to call Dr. Zannie Cove office or 911 for problems and concerns once discharged.

## 2013-04-10 ENCOUNTER — Ambulatory Visit: Payer: BC Managed Care – PPO | Admitting: Neurology

## 2013-04-18 ENCOUNTER — Encounter (HOSPITAL_COMMUNITY): Payer: Self-pay

## 2013-04-18 ENCOUNTER — Encounter (HOSPITAL_COMMUNITY)
Admission: RE | Admit: 2013-04-18 | Discharge: 2013-04-18 | Disposition: A | Discharge status: Home / Self Care | Payer: BC Managed Care – PPO | Source: Ambulatory Visit | Attending: Neurology | Admitting: Neurology

## 2013-04-18 VITALS — BP 114/60 | HR 86 | Temp 98.1°F | Resp 20 | Ht 63.5 in | Wt 165.0 lb

## 2013-04-18 DIAGNOSIS — G43909 Migraine, unspecified, not intractable, without status migrainosus: Secondary | ICD-10-CM | POA: Insufficient documentation

## 2013-04-18 MED ORDER — SODIUM CHLORIDE 0.9 % IV SOLN
INTRAVENOUS | Status: DC
  Administered 2013-04-18: 50 mL/h via INTRAVENOUS

## 2013-04-18 MED ORDER — SODIUM CHLORIDE 0.9 % IV SOLN
300.0000 mg | INTRAVENOUS | Status: DC
  Administered 2013-04-18: 300 mg via INTRAVENOUS
  Filled 2013-04-18: qty 15

## 2013-04-18 MED ORDER — LORATADINE 10 MG PO TABS
10.0000 mg | ORAL_TABLET | ORAL | Status: DC
  Administered 2013-04-18: 10 mg via ORAL
  Filled 2013-04-18: qty 1

## 2013-04-18 MED ORDER — ACETAMINOPHEN 500 MG PO TABS
1000.0000 mg | ORAL_TABLET | ORAL | Status: DC
  Administered 2013-04-18: 1000 mg via ORAL
  Filled 2013-04-18: qty 2

## 2013-05-16 ENCOUNTER — Encounter (HOSPITAL_COMMUNITY): Payer: Self-pay

## 2013-05-16 ENCOUNTER — Encounter (HOSPITAL_COMMUNITY)
Admission: RE | Admit: 2013-05-16 | Discharge: 2013-05-16 | Disposition: A | Discharge status: Home / Self Care | Payer: BC Managed Care – PPO | Source: Ambulatory Visit | Attending: Neurology | Admitting: Neurology

## 2013-05-16 VITALS — BP 113/67 | HR 71 | Temp 98.3°F | Resp 18

## 2013-05-16 DIAGNOSIS — G43909 Migraine, unspecified, not intractable, without status migrainosus: Secondary | ICD-10-CM

## 2013-05-16 MED ORDER — SODIUM CHLORIDE 0.9 % IV SOLN
300.0000 mg | INTRAVENOUS | Status: AC
  Administered 2013-05-16: 300 mg via INTRAVENOUS
  Filled 2013-05-16: qty 15

## 2013-05-16 MED ORDER — SODIUM CHLORIDE 0.9 % IV SOLN
INTRAVENOUS | Status: AC
  Administered 2013-05-16: 250 mL via INTRAVENOUS

## 2013-05-16 MED ORDER — LORATADINE 10 MG PO TABS
10.0000 mg | ORAL_TABLET | ORAL | Status: AC
  Administered 2013-05-16: 10 mg via ORAL
  Filled 2013-05-16: qty 1

## 2013-05-16 MED ORDER — ACETAMINOPHEN 500 MG PO TABS
1000.0000 mg | ORAL_TABLET | ORAL | Status: AC
  Administered 2013-05-16: 1000 mg via ORAL
  Filled 2013-05-16: qty 2

## 2013-05-16 NOTE — Progress Notes (Signed)
Pt did not stay after tyabri finished stating she has a birthday dinner to attend

## 2013-05-23 ENCOUNTER — Encounter: Payer: Self-pay | Admitting: Neurology

## 2013-05-23 ENCOUNTER — Ambulatory Visit (INDEPENDENT_AMBULATORY_CARE_PROVIDER_SITE_OTHER): Payer: BC Managed Care – PPO | Admitting: Neurology

## 2013-05-23 VITALS — BP 125/78 | HR 76 | Ht 63.5 in | Wt 145.0 lb

## 2013-05-23 DIAGNOSIS — G35 Multiple sclerosis: Secondary | ICD-10-CM

## 2013-05-23 MED ORDER — SERTRALINE HCL 50 MG PO TABS: 50.0000 mg | ORAL_TABLET | Freq: Every day | ORAL | Status: DC

## 2013-05-23 MED ORDER — OXYCODONE-ACETAMINOPHEN 5-325 MG PO TABS: 1.0000 | ORAL_TABLET | ORAL | Status: DC | PRN

## 2013-05-23 NOTE — Progress Notes (Signed)
History of Present Illness  HPI: Ms. Hanford is a 42 years old right-handed Caucasian female, followup for  Multiple sclerosis  In August 2013, she noticed numbness of her left foot, intermittent, faded away in 2 weeks  In September 2013, she noticed left arm numbness, itching underneath her skin, lasting for 2 weeks In October 2013, she began to notice unbalanced gait, dizziness, progressively worse, woke up one morning noticed double vision, worsening balance difficulty, binocular diplopia, spinning sensation, profound gait difficulty, fatigue, nausea as, as  She was admitted to [facility], July 13 2012  MRI brain [facility]) showed numerous white matter lesions which are predominately periventricular location suspicious for chronic MS, with asuspected acute demyelinating lesion in the right temporal lobe periventricular optic radiation.  Mild premature atrophy. No contrast was given. MRA brain was normal.  [facility], Jul 27, 2012 MRI of the brain showed Multiple chronic periventricular and subcortical chronic demyelinating plaques.There are 3 enhancing lesions likely acute demyelinating plaques in the right periatrial, left posterior frontal and left occipital regions   MRI cervical spine showed Multiple chronic demyelinating plaques at C2-3, C3, C4, C5-6, and C7. No contrast enhancement Laboratory evaluation showed normal ESR 10, CMP, CBC, negative Lyme titer, acetylcholine receptor antibody, ANA, TSH,  CSF, WBC 3, RBC 12, negative cryptococcal antigen, 5 oligoclonal bands, total protein 29, glucose 65 JC virus antibody was negative in Jul 19, 2102.   VEP 07/25/2012: was within normal limits bilaterally. No evidence of conduction slowing was seen within the anterior visual pathways on either side by today's evaluation.  She received IV steroid following diagnosis of multiple sclerosis with significant improvement of her symptoms,   She had Tysarbri since 12 2013, had 4 treatment  so far, no side effect, at [facility] shortstay, 3pm, takes 2-3 hours, infusion took one hour, observe for one hour after infusion, symptoms have improved, right leg has heat inside, feeling sun burn, no gait difficulty, no incontinence, no visual difficulty. 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 She lives with her daughter 42, driving no difficulty.  She is doing very well now, no gait difficulty, no visual loss, occasionally headaches, resolved by Percocet, she is working full-time, no difficulty handling her job, still taking Zoloft 50 mg for mild depression.  Review of Systems  Out of a complete 14 system review, the patient complains of only the following symptoms, and all other reviewed systems are negative.   Constitutional:   N/A Cardiovascular:  N/A Ear/Nose/Throat:  N/A Skin: N/A Eyes: N/A Respiratory: N/A Gastroitestinal: N/A    Hematology/Lymphatic:  N/A Musculoskeletal:N/A Endocrine:  N/A Neurological: right leg numbness Psychiatric:    N/A   Physical Exam  Neck: supple no carotid bruits Respiratory: clear to auscultation bilaterally Cardiovascular: regular rate rhythm  Neurologic Exam  Mental Status:  pleasant  middle-aged female,awake, alert, cooperative to history, talking, and casual conversation. Cranial Nerves: CN II-XII pupils were equal round reactive to light.  Fundi were sharp bilaterally.   left afferent pupillary defect,Extraocular movements were full.  Visual fields were full on confrontational test.  Facial sensation and strength were normal.  Hearing was intact to finger rubbing bilaterally.  Uvula tongue were midline.  Head turning and shoulder shrugging were normal and symmetric.  Tongue protrusion into the cheeks strength were normal.  Motor: Normal tone, bulk, and strength. Sensory: Normal to light touch, pinprick, proprioception, and vibratory sensation. Coordination: Normal finger-to-nose, heel-to-shin.  There was no dysmetria noticed. Gait and  Station: narrow  based, steady gait, could not perform tiptoe, tandem walking, Romberg sign: Negative Reflexes: Deep tendon reflexes: Biceps: 2/2, Brachioradialis: 2/2, Triceps: 2/2, Pateller: 3/3, Achilles: 2/2.  Plantar responses are flexor.   Assessment and Plan:  42 years old [place] American female, with relapsing remitting multiple sclerosis, active enhancing lesions involving brain,  and cervical spines. Jc-ab negative.  1, Tysarbri infusion 2. Repeat JC virus antibody, repeat MRI of the brain and cervical spine 3. RTC in 6 months with Eber Jones

## 2013-06-12 ENCOUNTER — Telehealth: Payer: Self-pay | Admitting: Neurology

## 2013-06-12 ENCOUNTER — Other Ambulatory Visit: Payer: Self-pay | Admitting: Neurology

## 2013-06-12 DIAGNOSIS — G35 Multiple sclerosis: Secondary | ICD-10-CM

## 2013-06-12 NOTE — Telephone Encounter (Signed)
[facility] needs Tysabri orders entered into EPIC for patient's appointment.

## 2013-06-13 ENCOUNTER — Encounter (HOSPITAL_COMMUNITY)
Admission: RE | Admit: 2013-06-13 | Discharge: 2013-06-13 | Disposition: A | Discharge status: Home / Self Care | Payer: BC Managed Care – PPO | Source: Ambulatory Visit | Attending: Neurology | Admitting: Neurology

## 2013-06-13 ENCOUNTER — Encounter (HOSPITAL_COMMUNITY): Payer: Self-pay

## 2013-06-13 VITALS — BP 109/73 | HR 80 | Temp 98.2°F | Resp 20 | Ht 63.5 in | Wt 194.2 lb

## 2013-06-13 DIAGNOSIS — G35 Multiple sclerosis: Secondary | ICD-10-CM | POA: Insufficient documentation

## 2013-06-13 MED ORDER — LORATADINE 10 MG PO TABS
10.0000 mg | ORAL_TABLET | ORAL | Status: DC
  Administered 2013-06-13: 10 mg via ORAL
  Filled 2013-06-13: qty 1

## 2013-06-13 MED ORDER — SODIUM CHLORIDE 0.9 % IV SOLN
300.0000 mg | INTRAVENOUS | Status: DC
  Administered 2013-06-13: 300 mg via INTRAVENOUS
  Filled 2013-06-13: qty 15

## 2013-06-13 MED ORDER — ACETAMINOPHEN 500 MG PO TABS
1000.0000 mg | ORAL_TABLET | ORAL | Status: DC
  Administered 2013-06-13: 1000 mg via ORAL
  Filled 2013-06-13: qty 2

## 2013-06-13 MED ORDER — SODIUM CHLORIDE 0.9 % IV SOLN
INTRAVENOUS | Status: DC
  Administered 2013-06-13: 14:00:00 via INTRAVENOUS

## 2013-06-13 NOTE — Progress Notes (Signed)
TYSABRI has infused and pt states she only wants to stay 30 minutes of the suggested 1 hour post infusion observation. Pt states she will contact  Her neurologist if she has questions or concerns

## 2013-06-21 ENCOUNTER — Ambulatory Visit (INDEPENDENT_AMBULATORY_CARE_PROVIDER_SITE_OTHER): Payer: BC Managed Care – PPO

## 2013-06-21 DIAGNOSIS — G35 Multiple sclerosis: Secondary | ICD-10-CM

## 2013-06-21 MED ORDER — GADOPENTETATE DIMEGLUMINE 469.01 MG/ML IV SOLN: 18.0000 mL | Freq: Once | INTRAVENOUS | Status: AC | PRN

## 2013-06-22 NOTE — Telephone Encounter (Signed)
Patient received tysabri on 06-13-13.

## 2013-06-22 NOTE — Progress Notes (Signed)
Quick Note:  Please call patient, MRI cervical and brain continued to show evidence of MS lesions, no significant change compared to previous scan in 2013. ______

## 2013-06-26 NOTE — Progress Notes (Signed)
Quick Note:  Left message for patient that MRI's cervical and brain showed evidence of MS lesions, but no significant change compared to previous scan in 2013, per Dr. Terrace Arabia. Told to call with any questions. ______

## 2013-07-11 ENCOUNTER — Encounter (HOSPITAL_COMMUNITY): Payer: Self-pay

## 2013-07-11 ENCOUNTER — Encounter (HOSPITAL_COMMUNITY)
Admission: RE | Admit: 2013-07-11 | Discharge: 2013-07-11 | Disposition: A | Discharge status: Home / Self Care | Payer: BC Managed Care – PPO | Source: Ambulatory Visit | Attending: Neurology | Admitting: Neurology

## 2013-07-11 VITALS — BP 120/79 | HR 64 | Temp 96.8°F | Resp 20 | Ht 63.5 in | Wt 194.0 lb

## 2013-07-11 DIAGNOSIS — G35 Multiple sclerosis: Secondary | ICD-10-CM | POA: Insufficient documentation

## 2013-07-11 MED ORDER — SODIUM CHLORIDE 0.9 % IV SOLN
INTRAVENOUS | Status: DC
  Administered 2013-07-11: 14:00:00 via INTRAVENOUS

## 2013-07-11 MED ORDER — ACETAMINOPHEN 500 MG PO TABS
1000.0000 mg | ORAL_TABLET | ORAL | Status: DC
  Administered 2013-07-11: 1000 mg via ORAL
  Filled 2013-07-11: qty 2

## 2013-07-11 MED ORDER — SODIUM CHLORIDE 0.9 % IV SOLN
300.0000 mg | INTRAVENOUS | Status: DC
  Administered 2013-07-11: 300 mg via INTRAVENOUS
  Filled 2013-07-11: qty 15

## 2013-07-11 MED ORDER — LORATADINE 10 MG PO TABS
10.0000 mg | ORAL_TABLET | ORAL | Status: DC
  Administered 2013-07-11: 10 mg via ORAL
  Filled 2013-07-11: qty 1

## 2013-07-13 ENCOUNTER — Other Ambulatory Visit: Payer: Self-pay

## 2013-08-08 ENCOUNTER — Encounter (HOSPITAL_COMMUNITY): Payer: Self-pay

## 2013-08-08 ENCOUNTER — Encounter (HOSPITAL_COMMUNITY)
Admission: RE | Admit: 2013-08-08 | Discharge: 2013-08-08 | Disposition: A | Discharge status: Home / Self Care | Payer: BC Managed Care – PPO | Source: Ambulatory Visit | Attending: Neurology | Admitting: Neurology

## 2013-08-08 VITALS — BP 101/61 | HR 88 | Temp 98.2°F | Resp 16

## 2013-08-08 DIAGNOSIS — G35 Multiple sclerosis: Secondary | ICD-10-CM | POA: Insufficient documentation

## 2013-08-08 MED ORDER — SODIUM CHLORIDE 0.9 % IV SOLN
300.0000 mg | INTRAVENOUS | Status: AC
  Administered 2013-08-08: 300 mg via INTRAVENOUS
  Filled 2013-08-08: qty 15

## 2013-08-08 MED ORDER — SODIUM CHLORIDE 0.9 % IV SOLN
INTRAVENOUS | Status: AC
  Administered 2013-08-08: 14:00:00 via INTRAVENOUS

## 2013-08-08 MED ORDER — LORATADINE 10 MG PO TABS
10.0000 mg | ORAL_TABLET | ORAL | Status: AC
  Administered 2013-08-08: 10 mg via ORAL
  Filled 2013-08-08: qty 1

## 2013-08-08 MED ORDER — ACETAMINOPHEN 500 MG PO TABS
1000.0000 mg | ORAL_TABLET | ORAL | Status: AC
  Administered 2013-08-08: 1000 mg via ORAL
  Filled 2013-08-08: qty 2

## 2013-09-05 ENCOUNTER — Encounter (HOSPITAL_COMMUNITY): Payer: Self-pay

## 2013-09-05 ENCOUNTER — Encounter (HOSPITAL_COMMUNITY)
Admission: RE | Admit: 2013-09-05 | Discharge: 2013-09-05 | Disposition: A | Discharge status: Home / Self Care | Payer: BC Managed Care – PPO | Source: Ambulatory Visit | Attending: Neurology | Admitting: Neurology

## 2013-09-05 VITALS — BP 122/71 | HR 75 | Temp 97.9°F | Resp 16 | Ht 63.5 in | Wt 194.0 lb

## 2013-09-05 DIAGNOSIS — G35 Multiple sclerosis: Secondary | ICD-10-CM

## 2013-09-05 MED ORDER — SODIUM CHLORIDE 0.9 % IV SOLN
300.0000 mg | INTRAVENOUS | Status: AC
  Administered 2013-09-05: 300 mg via INTRAVENOUS
  Filled 2013-09-05: qty 15

## 2013-09-05 MED ORDER — ACETAMINOPHEN 500 MG PO TABS
1000.0000 mg | ORAL_TABLET | ORAL | Status: AC
  Administered 2013-09-05: 1000 mg via ORAL
  Filled 2013-09-05: qty 2

## 2013-09-05 MED ORDER — SODIUM CHLORIDE 0.9 % IV SOLN
INTRAVENOUS | Status: AC
  Administered 2013-09-05: 14:00:00 via INTRAVENOUS

## 2013-09-05 MED ORDER — LORATADINE 10 MG PO TABS
10.0000 mg | ORAL_TABLET | ORAL | Status: AC
  Administered 2013-09-05: 10 mg via ORAL
  Filled 2013-09-05: qty 1

## 2013-09-29 ENCOUNTER — Telehealth: Payer: Self-pay | Admitting: *Deleted

## 2013-09-29 ENCOUNTER — Other Ambulatory Visit: Payer: Self-pay | Admitting: Neurology

## 2013-09-29 DIAGNOSIS — G35 Multiple sclerosis: Secondary | ICD-10-CM

## 2013-09-29 NOTE — Telephone Encounter (Signed)
Order for Suzanne Robbins is placed Sep 29 2013 for 3 months

## 2013-09-29 NOTE — Telephone Encounter (Signed)
Need order for tysabri, due on Tuesday.

## 2013-10-03 ENCOUNTER — Encounter (HOSPITAL_COMMUNITY)
Admission: RE | Admit: 2013-10-03 | Discharge: 2013-10-03 | Disposition: A | Discharge status: Home / Self Care | Payer: BC Managed Care – PPO | Source: Ambulatory Visit | Attending: Neurology | Admitting: Neurology

## 2013-10-03 ENCOUNTER — Encounter (HOSPITAL_COMMUNITY): Payer: Self-pay

## 2013-10-03 VITALS — BP 112/66 | HR 76 | Temp 98.2°F | Resp 20 | Ht 63.5 in | Wt 194.0 lb

## 2013-10-03 DIAGNOSIS — G35 Multiple sclerosis: Secondary | ICD-10-CM | POA: Insufficient documentation

## 2013-10-03 MED ORDER — SODIUM CHLORIDE 0.9 % IV SOLN
300.0000 mg | INTRAVENOUS | Status: DC
  Administered 2013-10-03: 300 mg via INTRAVENOUS
  Filled 2013-10-03: qty 15

## 2013-10-03 MED ORDER — SODIUM CHLORIDE 0.9 % IV SOLN
INTRAVENOUS | Status: DC
  Administered 2013-10-03: 14:00:00 via INTRAVENOUS

## 2013-10-03 MED ORDER — LORATADINE 10 MG PO TABS
10.0000 mg | ORAL_TABLET | ORAL | Status: DC
  Administered 2013-10-03: 10 mg via ORAL
  Filled 2013-10-03: qty 1

## 2013-10-03 MED ORDER — ACETAMINOPHEN 500 MG PO TABS
1000.0000 mg | ORAL_TABLET | ORAL | Status: DC
  Administered 2013-10-03: 1000 mg via ORAL
  Filled 2013-10-03: qty 2

## 2013-10-03 NOTE — Discharge Instructions (Signed)

## 2013-10-31 ENCOUNTER — Encounter (HOSPITAL_COMMUNITY): Payer: Self-pay

## 2013-10-31 ENCOUNTER — Encounter (HOSPITAL_COMMUNITY)
Admission: RE | Admit: 2013-10-31 | Discharge: 2013-10-31 | Disposition: A | Discharge status: Home / Self Care | Payer: BC Managed Care – PPO | Source: Ambulatory Visit | Attending: Neurology | Admitting: Neurology

## 2013-10-31 VITALS — BP 118/79 | HR 71 | Temp 97.1°F | Resp 20 | Ht 63.5 in | Wt 194.0 lb

## 2013-10-31 DIAGNOSIS — G35 Multiple sclerosis: Secondary | ICD-10-CM | POA: Insufficient documentation

## 2013-10-31 MED ORDER — SODIUM CHLORIDE 0.9 % IV SOLN
300.0000 mg | INTRAVENOUS | Status: DC
  Administered 2013-10-31: 300 mg via INTRAVENOUS
  Filled 2013-10-31: qty 15

## 2013-10-31 MED ORDER — SODIUM CHLORIDE 0.9 % IV SOLN
INTRAVENOUS | Status: DC
  Administered 2013-10-31: 250 mL via INTRAVENOUS

## 2013-10-31 MED ORDER — ACETAMINOPHEN 500 MG PO TABS
1000.0000 mg | ORAL_TABLET | ORAL | Status: DC
  Administered 2013-10-31: 1000 mg via ORAL
  Filled 2013-10-31: qty 2

## 2013-10-31 MED ORDER — LORATADINE 10 MG PO TABS
10.0000 mg | ORAL_TABLET | ORAL | Status: DC
  Administered 2013-10-31: 10 mg via ORAL
  Filled 2013-10-31: qty 1

## 2013-10-31 NOTE — Discharge Instructions (Signed)
Natalizumab injection °What is this medicine? °NATALIZUMAB (na ta LIZ you mab) is used to treat relapsing multiple sclerosis. This drug is not a cure. It is also used to treat Crohn's disease. °This medicine may be used for other purposes; ask your health care provider or pharmacist if you have questions. °COMMON BRAND NAME(S): Tysabri °What should I tell my health care provider before I take this medicine? °They need to know if you have any of these conditions: °-immune system problems °-progressive multifocal leukoencephalopathy (PML) °-an unusual or allergic reaction to natalizumab, other medicines, foods, dyes, or preservatives °-pregnant or trying to get pregnant °-breast-feeding °How should I use this medicine? °This medicine is for infusion into a vein. It is given by a health care professional in a hospital or clinic setting. °A special MedGuide will be given to you by the pharmacist with each prescription and refill. Be sure to read this information carefully each time. °Talk to your pediatrician regarding the use of this medicine in children. This medicine is not approved for use in children. °Overdosage: If you think you have taken too much of this medicine contact a poison control center or emergency room at once. °NOTE: This medicine is only for you. Do not share this medicine with others. °What if I miss a dose? °It is important not to miss your dose. Call your doctor or health care professional if you are unable to keep an appointment. °What may interact with this medicine? °-azathioprine °-cyclosporine °-interferon °-6-mercaptopurine °-methotrexate °-steroid medicines like prednisone or cortisone °-TNF-alpha inhibitors like adalimumab, etanercept, and infliximab °-vaccines °This list may not describe all possible interactions. Give your health care provider a list of all the medicines, herbs, non-prescription drugs, or dietary supplements you use. Also tell them if you smoke, drink alcohol, or use  illegal drugs. Some items may interact with your medicine. °What should I watch for while using this medicine? °Your condition will be monitored carefully while you are receiving this medicine. Visit your doctor for regular check ups. Tell your doctor or healthcare professional if your symptoms do not start to get better or if they get worse. °Stay away from people who are sick. Call your doctor or health care professional for advice if you get a fever, chills or sore throat, or other symptoms of a cold or flu. Do not treat yourself. °In some patients, this medicine may cause a serious brain infection that may cause death. If you have any problems seeing, thinking, speaking, walking, or standing, tell your doctor right away. If you cannot reach your doctor, get urgent medical care. °What side effects may I notice from receiving this medicine? °Side effects that you should report to your doctor or health care professional as soon as possible: °-allergic reactions like skin rash, itching or hives, swelling of the face, lips, or tongue °-breathing problems °-changes in vision °-chest pain °-dark urine °-depression, feelings of sadness °-dizziness °-general ill feeling or flu-like symptoms °-irregular, missed, or painful menstrual periods °-light-colored stools °-loss of appetite, nausea °-muscle weakness °-problems with balance, talking, or walking °-right upper belly pain °-unusually weak or tired °-yellowing of the eyes or skin °Side effects that usually do not require medical attention (report to your doctor or health care professional if they continue or are bothersome): °-aches, pains °-headache °-stomach upset °-tiredness °This list may not describe all possible side effects. Call your doctor for medical advice about side effects. You may report side effects to FDA at 1-800-FDA-1088. °Where should I keep   my medicine? °This drug is given in a hospital or clinic and will not be stored at home. °NOTE: This sheet is  a summary. It may not cover all possible information. If you have questions about this medicine, talk to your doctor, pharmacist, or health care provider. °© 2014, [employer]vier/Gold Standard. (2008-10-13 13:33:21) °Multiple Sclerosis °Multiple sclerosis (MS) is a disease of the central nervous system. It leads to loss of the insulating covering of the nerves (myelin sheath) of your brain. When this happens, brain signals do not get transmitted properly or may not get transmitted at all. The symptoms of MS occur in episodes or attacks. These attacks may last weeks to months. There may be long periods of nearly no problems between attacks. The age of onset of MS varies.  °CAUSES °The cause of MS is unknown. However, it is more common in the [place] than in the southern [place]. °RISK FACTORS °There is a higher incidence of MS in women than in men. MS is not an inherited illness, although your risk of MS is higher if you have a relative with MS. °SIGNS AND SYMPTOMS  °The symptoms of MS occur in episodes or attacks. These attacks may last weeks to months. There may be long periods of almost no symptoms between attacks. °The symptoms of MS vary. This is because of the many different ways it affects the central nervous system. The main symptoms of MS include: °· Vision problems and eye pain. °· Numbness. °· Weakness. °· Paralysis in your arms, hands, feet, and legs (extremities). °· Balance problems. °· Tremors. °DIAGNOSIS  °Your health care provider can diagnose MS with the help of imaging exams and lab tests. These may include specialized X-ray exams and spinal fluid tests. The best imaging exam to confirm a diagnosis of MS is MRI. °TREATMENT  °There is no known cure for MS, but there are medicines that can decrease the number and frequency of attacks. Steroids are often used for short-term relief. Physical and occupational therapy may also help. °HOME CARE INSTRUCTIONS  °· Take medicines as directed by  your health care provider. °· Exercise as directed by your health care provider. °SEEK MEDICAL CARE IF: °You begin to feel depressed. °SEEK IMMEDIATE MEDICAL CARE IF: °· You develop paralysis. °· You develop problems with bladder, bowel, or sexual function. °· You develop mental changes, such as forgetfulness or mood swings. °· You have a seizure. °Document Released: 08/21/2000 Document Revised: 06/14/2013 Document Reviewed: 05/01/2013 °ExitCare® Patient Information ©2014 [employer]. ° ° °

## 2013-10-31 NOTE — Progress Notes (Signed)
Tysabri has infused, pt. Stayed 10 minutes post-transfusion, pt. To follow-up with MD with any problems.

## 2013-11-20 ENCOUNTER — Encounter: Payer: Self-pay | Admitting: Nurse Practitioner

## 2013-11-20 ENCOUNTER — Ambulatory Visit (INDEPENDENT_AMBULATORY_CARE_PROVIDER_SITE_OTHER): Payer: BC Managed Care – PPO | Admitting: Nurse Practitioner

## 2013-11-20 VITALS — BP 118/76 | HR 89 | Ht 63.5 in | Wt 204.0 lb

## 2013-11-20 DIAGNOSIS — Z79899 Other long term (current) drug therapy: Secondary | ICD-10-CM

## 2013-11-20 DIAGNOSIS — G35 Multiple sclerosis: Secondary | ICD-10-CM

## 2013-11-20 LAB — CBC WITH DIFFERENTIAL/PLATELET
Basophils Absolute: 0 x10E3/uL (ref 0.0–0.2)
Basos: 0 %
Eos: 1 %
Eosinophils Absolute: 0.1 x10E3/uL (ref 0.0–0.4)
HCT: 36.9 % (ref 34.0–46.6)
Hemoglobin: 12.8 g/dL (ref 11.1–15.9)
Lymphocytes Absolute: 2.9 x10E3/uL (ref 0.7–3.1)
Lymphs: 31 %
MCH: 29.9 pg (ref 26.6–33.0)
MCHC: 34.7 g/dL (ref 31.5–35.7)
MCV: 86 fL (ref 79–97)
Monocytes Absolute: 0.8 x10E3/uL (ref 0.1–0.9)
Monocytes: 9 %
Neutrophils Absolute: 5.7 x10E3/uL (ref 1.4–7.0)
Neutrophils Relative %: 59 %
RBC: 4.28 x10E6/uL (ref 3.77–5.28)
RDW: 13.6 % (ref 12.3–15.4)
WBC: 9.5 x10E3/uL (ref 3.4–10.8)

## 2013-11-20 NOTE — Progress Notes (Signed)
[facility]  PATIENT: Suzanne Robbins DOB: 1971-03-03   REASON FOR VISIT: Followup for multiple sclerosis    HISTORY OF PRESENT ILLNESS:[name], 43 -year-old female returns for followup. She has a history of relapsing remitting multiple sclerosis, has  been on Tysabri since December 2013. Last JC antibody was intermediate. She complains with fatigue and occasional headache. She denies any falls, any sensory changes, any problems with her vision, speech or swallowing problems, loss of bowel or bladder control. She returns for reevaluation.   HISTORY: In August 2013, she noticed numbness of her left foot, intermittent, faded away in 2 weeks  In September 2013, she noticed left arm numbness, itching underneath her skin, lasting for 2 weeks  In October 2013, she began to notice unbalanced gait, dizziness, progressively worse, woke up one morning noticed double vision, worsening balance difficulty, binocular diplopia, spinning sensation, profound gait difficulty, fatigue, nausea as, as  She was admitted to [facility], July 13 2012 . MRI brain ([place]) showed numerous white matter lesions which are predominately periventricular location suspicious for chronic MS, with suspected acute demyelinating lesion in the right temporal lobe periventricular optic radiation. Mild premature atrophy. No contrast was given. MRA brain was normal.  [facility], Jul 27, 2012 MRI of the brain showed Multiple chronic periventricular and subcortical chronic demyelinating plaques.There are 3 enhancing lesions likely acute demyelinating plaques in the right periatrial, left posterior frontal and left occipital regions  MRI cervical spine showed Multiple chronic demyelinating plaques at C2-3, C3, C4, C5-6, and C7. No contrast enhancement  Laboratory evaluation showed normal ESR 10, CMP, CBC, negative Lyme titer, acetylcholine receptor antibody, ANA, TSH,  CSF, WBC 3, RBC 12, negative  cryptococcal antigen, 5 oligoclonal bands, total protein 29, glucose 65  JC virus antibody was negative in Jul 19, 2102.  VEP 07/25/2012: was within normal limits bilaterally. No evidence of conduction slowing was seen within the anterior visual pathways on either side by today's evaluation.  She received IV steroid following diagnosis of multiple sclerosis with significant improvement of her symptoms,  She had Tysarbri since 12 2013, had 4 treatment so far, no side effect, at [facility] shortstay, 3pm, takes 2-3 hours, infusion took one hour, observe for one hour after infusion, symptoms have improved, right leg has heat inside, feeling sun burn, no gait difficulty, no incontinence, no visual difficulty.  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 She lives with her daughter 26, driving no difficulty.  She is doing very well now, no gait difficulty, no visual loss, occasionally headaches, resolved by Percocet, she is working full-time, no difficulty handling her job, still taking Zoloft 50 mg for mild depression.   REVIEW OF SYSTEMS: Full 14 system review of systems performed and notable only for those listed, all others are neg:  Constitutional: Fatigue Cardiovascular: N/A  Ear/Nose/Throat: N/A  Skin: N/A  Eyes: N/A  Respiratory: N/A  Gastroitestinal: N/A  Hematology/Lymphatic: N/A  Endocrine: N/A Musculoskeletal:N/A  Allergy/Immunology: N/A  Neurological: Occasional headache  Psychiatric: N/A   ALLERGIES: No Known Allergies  HOME MEDICATIONS: Outpatient Prescriptions Prior to Visit  Medication Sig Dispense Refill  . estradiol (ESTRACE) 2 MG tablet Take 2 mg by mouth daily.      Marland Kitchen ibuprofen (ADVIL,MOTRIN) 200 MG tablet Take 400 mg by mouth every 6 (six) hours as needed. For pain      . oxyCODONE-acetaminophen (PERCOCET/ROXICET) 5-325 MG per tablet Take 1 tablet by mouth every 4 (four) hours as needed  for pain.  60 tablet  0  . sertraline (ZOLOFT) 50 MG tablet Take 1 tablet (50 mg  total) by mouth daily.  30 tablet  12   No facility-administered medications prior to visit.    PAST MEDICAL HISTORY: Past Medical History  Diagnosis Date  . Uterine cancer 2000  . MS (multiple sclerosis) 07/13/12    PAST SURGICAL HISTORY: Past Surgical History  Procedure Laterality Date  . Abdominal hysterectomy  2000    FAMILY HISTORY: Family History  Problem Relation Age of Onset  . Lung cancer Father     SOCIAL HISTORY: History   Social History  . Marital Status: Legally Separated    Spouse Name: N/A    Number of Children: 2  . Years of Education: 14   Occupational History  . banker   .     Social History Main Topics  . Smoking status: Never Smoker   . Smokeless tobacco: Never Used  . Alcohol Use: No  . Drug Use: No  . Sexual Activity: Not on file   Other Topics Concern  . Not on file   Social History Narrative   Patient lives at home with her daughter. Patient has a two years of college education.   Right handed.   Caffeine- two sodas daily.   Patient is separated.      PHYSICAL EXAM  Filed Vitals:   11/20/13 1100  BP: 118/76  Pulse: 89  Height: 5' 3.5" (1.613 m)  Weight: 204 lb (92.534 kg)   Body mass index is 35.57 kg/(m^2).  Generalized: Well developed, obese female in no acute distress  Head: normocephalic and atraumatic,. Oropharynx benign  Neck: Supple, no carotid bruits  Cardiac: Regular rate rhythm, no murmur  Musculoskeletal: No deformity   Neurological examination   Mentation: Alert oriented to time, place, history taking. Follows all commands speech and language fluent  Cranial nerve II-XII: Fundoscopic exam reveals sharp disc margins.Left afferent pupillary defect Pupils were equal round reactive to light extraocular movements were full, visual field were full on confrontational test. Visual acuity 20/30 right, 20/40 left .Facial sensation and strength were normal. hearing was intact to finger rubbing bilaterally. Uvula  tongue midline. head turning and shoulder shrug were normal and symmetric.Tongue protrusion into cheek strength was normal. Motor: normal bulk and tone, full strength in the BUE, BLE, fine finger movements normal, no pronator drift. No focal weakness Sensory: normal and symmetric to light touch, pinprick, and  vibration  Coordination: finger-nose-finger, heel-to-shin bilaterally, no dysmetria Reflexes: Brachioradialis 2/2, biceps 2/2, triceps 2/2, patellar 3/3, Achilles 2/2, plantar responses were flexor bilaterally. Gait and Station: Rising up from seated position without assistance, normal stance,  moderate stride, good arm swing, smooth turning, able to perform tiptoe, and heel walking without difficulty. Tandem gait is steady.   DIAGNOSTIC DATA (LABS, IMAGING, TESTING) - I reviewed patient records, labs, notes, testing and imaging myself where available.  Lab Results  Component Value Date   WBC 9.3 07/14/2012   HGB 12.3 07/14/2012   HCT 36.3 07/14/2012   MCV 90.3 07/14/2012   PLT 255 07/14/2012      Component Value Date/Time   NA 136 07/13/2012 1216   K 4.0 07/13/2012 1216   CL 100 07/13/2012 1216   CO2 26 07/13/2012 1216   GLUCOSE 116* 07/13/2012 1216   BUN 11 07/13/2012 1216   CREATININE 0.79 07/13/2012 1216   CALCIUM 9.4 07/13/2012 1216   PROT 7.0 07/13/2012 1216   ALBUMIN 3.8 07/13/2012 1216  AST 20 07/13/2012 1216   ALT 14 07/13/2012 1216   ALKPHOS 66 07/13/2012 1216   BILITOT 0.3 07/13/2012 1216   GFRNONAA >90 07/13/2012 1216   GFRAA >90 07/13/2012 1216    ASSESSMENT AND PLAN  43 y.o. year old female  has a past medical history of relapsing remitting multiple sclerosis. Most recent MRI of the brain and cervical spine in September 2014 was stable. Patient remains on Tysabri and her last JC antibody was intermittent.  Continue Tysabri Will check JC antibody today along with CBC Given information on common migraine triggers such as foods environmental etc. Followup in 6 months Dennie Bible, [facility], [facility], [facility] [address] [place], [place] [address] [phone]

## 2013-11-20 NOTE — Patient Instructions (Signed)
Continue Tysabri Will check JC antibody today along with CBC Last MRI of the brain and cervical spine 6 months ago without change Given information on common migraine triggers such as foods environmental etc. Followup in 6 months

## 2013-11-28 ENCOUNTER — Encounter (HOSPITAL_COMMUNITY): Payer: Self-pay

## 2013-11-28 ENCOUNTER — Encounter (HOSPITAL_COMMUNITY)
Admission: RE | Admit: 2013-11-28 | Discharge: 2013-11-28 | Disposition: A | Discharge status: Home / Self Care | Payer: BC Managed Care – PPO | Source: Ambulatory Visit | Attending: Neurology | Admitting: Neurology

## 2013-11-28 VITALS — BP 116/53 | HR 89 | Temp 97.7°F | Resp 16

## 2013-11-28 DIAGNOSIS — G35 Multiple sclerosis: Secondary | ICD-10-CM | POA: Insufficient documentation

## 2013-11-28 MED ORDER — SODIUM CHLORIDE 0.9 % IV SOLN
INTRAVENOUS | Status: AC
  Administered 2013-11-28: 14:00:00 via INTRAVENOUS

## 2013-11-28 MED ORDER — LORATADINE 10 MG PO TABS
10.0000 mg | ORAL_TABLET | ORAL | Status: AC
  Administered 2013-11-28: 10 mg via ORAL
  Filled 2013-11-28: qty 1

## 2013-11-28 MED ORDER — ACETAMINOPHEN 500 MG PO TABS
1000.0000 mg | ORAL_TABLET | ORAL | Status: AC
  Administered 2013-11-28: 1000 mg via ORAL
  Filled 2013-11-28: qty 2

## 2013-11-28 MED ORDER — SODIUM CHLORIDE 0.9 % IV SOLN
300.0000 mg | INTRAVENOUS | Status: AC
  Administered 2013-11-28: 300 mg via INTRAVENOUS
  Filled 2013-11-28: qty 15

## 2013-11-28 NOTE — Progress Notes (Signed)
tysabri complete. Pt stayed 14min post infusion.  Pt states she is not staying, going home, understands to call MD with questions/concerns.

## 2013-12-26 ENCOUNTER — Encounter (HOSPITAL_COMMUNITY): Payer: Self-pay

## 2013-12-26 ENCOUNTER — Encounter (HOSPITAL_COMMUNITY): Payer: BC Managed Care – PPO

## 2013-12-26 ENCOUNTER — Encounter (HOSPITAL_COMMUNITY)
Admission: RE | Admit: 2013-12-26 | Discharge: 2013-12-26 | Disposition: A | Discharge status: Home / Self Care | Payer: BC Managed Care – PPO | Source: Ambulatory Visit | Attending: Neurology | Admitting: Neurology

## 2013-12-26 VITALS — BP 112/75 | HR 70 | Temp 97.9°F | Resp 20

## 2013-12-26 DIAGNOSIS — G35 Multiple sclerosis: Secondary | ICD-10-CM | POA: Insufficient documentation

## 2013-12-26 MED ORDER — SODIUM CHLORIDE 0.9 % IV SOLN
300.0000 mg | INTRAVENOUS | Status: AC
  Administered 2013-12-26: 300 mg via INTRAVENOUS
  Filled 2013-12-26: qty 15

## 2013-12-26 MED ORDER — ACETAMINOPHEN 500 MG PO TABS
1000.0000 mg | ORAL_TABLET | ORAL | Status: AC
  Administered 2013-12-26: 1000 mg via ORAL
  Filled 2013-12-26: qty 2

## 2013-12-26 MED ORDER — LORATADINE 10 MG PO TABS
10.0000 mg | ORAL_TABLET | ORAL | Status: AC
  Administered 2013-12-26: 10 mg via ORAL
  Filled 2013-12-26: qty 1

## 2013-12-26 MED ORDER — SODIUM CHLORIDE 0.9 % IV SOLN
INTRAVENOUS | Status: AC
  Administered 2013-12-26: 14:00:00 via INTRAVENOUS

## 2013-12-26 NOTE — Discharge Instructions (Signed)

## 2013-12-26 NOTE — Progress Notes (Signed)
Tysabri completed, pt. Stayed 15 minutes post infusion, pt. To follow-up with doctor with any problems.

## 2014-01-22 ENCOUNTER — Other Ambulatory Visit: Payer: Self-pay | Admitting: Neurology

## 2014-01-22 ENCOUNTER — Telehealth: Payer: Self-pay | Admitting: Neurology

## 2014-01-22 DIAGNOSIS — G35 Multiple sclerosis: Secondary | ICD-10-CM

## 2014-01-22 NOTE — Telephone Encounter (Signed)
[facility] needs order for Tysabri put in Epic, appointment is tomorrow 01-23-14.

## 2014-01-22 NOTE — Telephone Encounter (Signed)
Order was placed for IV Tysarbri infusion

## 2014-01-23 ENCOUNTER — Encounter (HOSPITAL_COMMUNITY)
Admission: RE | Admit: 2014-01-23 | Discharge: 2014-01-23 | Disposition: A | Discharge status: Home / Self Care | Payer: BC Managed Care – PPO | Source: Ambulatory Visit | Attending: Neurology | Admitting: Neurology

## 2014-01-23 ENCOUNTER — Encounter (HOSPITAL_COMMUNITY): Payer: Self-pay

## 2014-01-23 VITALS — BP 112/65 | HR 73 | Temp 98.0°F | Resp 18

## 2014-01-23 DIAGNOSIS — G35 Multiple sclerosis: Secondary | ICD-10-CM

## 2014-01-23 MED ORDER — SODIUM CHLORIDE 0.9 % IV SOLN: INTRAVENOUS | Status: DC

## 2014-01-23 MED ORDER — ACETAMINOPHEN 500 MG PO TABS
1000.0000 mg | ORAL_TABLET | ORAL | Status: DC
  Administered 2014-01-23: 1000 mg via ORAL
  Filled 2014-01-23: qty 2

## 2014-01-23 MED ORDER — LORATADINE 10 MG PO TABS
10.0000 mg | ORAL_TABLET | ORAL | Status: DC
  Administered 2014-01-23: 10 mg via ORAL
  Filled 2014-01-23: qty 1

## 2014-01-23 MED ORDER — SODIUM CHLORIDE 0.9 % IV SOLN
300.0000 mg | INTRAVENOUS | Status: DC
  Administered 2014-01-23: 300 mg via INTRAVENOUS
  Filled 2014-01-23: qty 15

## 2014-01-23 NOTE — Progress Notes (Signed)
Patient remained in medical short stay 15 min past infusion. No infusion reaction noted.  To call doctor for any problems or questions.

## 2014-02-20 ENCOUNTER — Encounter (HOSPITAL_COMMUNITY): Payer: Self-pay

## 2014-02-20 ENCOUNTER — Encounter (HOSPITAL_COMMUNITY)
Admission: RE | Admit: 2014-02-20 | Discharge: 2014-02-20 | Disposition: A | Discharge status: Home / Self Care | Payer: BC Managed Care – PPO | Source: Ambulatory Visit | Attending: Neurology | Admitting: Neurology

## 2014-02-20 VITALS — BP 112/67 | HR 72 | Temp 98.3°F | Resp 24

## 2014-02-20 DIAGNOSIS — G35 Multiple sclerosis: Secondary | ICD-10-CM

## 2014-02-20 MED ORDER — ACETAMINOPHEN 500 MG PO TABS
1000.0000 mg | ORAL_TABLET | ORAL | Status: DC
  Administered 2014-02-20: 1000 mg via ORAL
  Filled 2014-02-20: qty 2

## 2014-02-20 MED ORDER — SODIUM CHLORIDE 0.9 % IV SOLN
INTRAVENOUS | Status: DC
  Administered 2014-02-20: 13:00:00 via INTRAVENOUS

## 2014-02-20 MED ORDER — LORATADINE 10 MG PO TABS
10.0000 mg | ORAL_TABLET | ORAL | Status: DC
  Administered 2014-02-20: 10 mg via ORAL
  Filled 2014-02-20: qty 1

## 2014-02-20 MED ORDER — SODIUM CHLORIDE 0.9 % IV SOLN
300.0000 mg | INTRAVENOUS | Status: DC
  Administered 2014-02-20: 300 mg via INTRAVENOUS
  Filled 2014-02-20: qty 15

## 2014-02-20 NOTE — Discharge Instructions (Signed)

## 2014-03-20 ENCOUNTER — Encounter (HOSPITAL_COMMUNITY): Payer: Self-pay

## 2014-03-20 ENCOUNTER — Encounter (HOSPITAL_COMMUNITY)
Admission: RE | Admit: 2014-03-20 | Discharge: 2014-03-20 | Disposition: A | Discharge status: Home / Self Care | Payer: BC Managed Care – PPO | Source: Ambulatory Visit | Attending: Neurology | Admitting: Neurology

## 2014-03-20 VITALS — BP 116/75 | HR 66 | Temp 98.1°F | Resp 16 | Ht 63.5 in | Wt 196.0 lb

## 2014-03-20 DIAGNOSIS — G35 Multiple sclerosis: Secondary | ICD-10-CM

## 2014-03-20 MED ORDER — ACETAMINOPHEN 500 MG PO TABS
1000.0000 mg | ORAL_TABLET | ORAL | Status: AC
  Administered 2014-03-20: 1000 mg via ORAL
  Filled 2014-03-20: qty 2

## 2014-03-20 MED ORDER — SODIUM CHLORIDE 0.9 % IV SOLN
300.0000 mg | INTRAVENOUS | Status: AC
  Administered 2014-03-20: 300 mg via INTRAVENOUS
  Filled 2014-03-20: qty 15

## 2014-03-20 MED ORDER — SODIUM CHLORIDE 0.9 % IV SOLN
INTRAVENOUS | Status: AC
  Administered 2014-03-20: 13:00:00 via INTRAVENOUS

## 2014-03-20 MED ORDER — EPINEPHRINE 0.3 MG/0.3ML IJ SOAJ: 0.3000 mg | INTRAMUSCULAR | Status: DC

## 2014-03-20 MED ORDER — SODIUM CHLORIDE 0.9 % IV SOLN: 500.0000 mg | INTRAVENOUS | Status: DC

## 2014-03-20 MED ORDER — LORATADINE 10 MG PO TABS
10.0000 mg | ORAL_TABLET | ORAL | Status: AC
Start: 2014-03-20 — End: 2014-03-20
  Administered 2014-03-20: 10 mg via ORAL
  Filled 2014-03-20: qty 1

## 2014-03-20 NOTE — Progress Notes (Signed)
Patient has tolerated treatment well but refuses to stay the required 1 hour post treatment

## 2014-04-03 ENCOUNTER — Other Ambulatory Visit: Payer: Self-pay | Admitting: Neurology

## 2014-04-03 MED ORDER — OXYCODONE-ACETAMINOPHEN 5-325 MG PO TABS: 1.0000 | ORAL_TABLET | ORAL | Status: DC | PRN

## 2014-04-03 NOTE — Telephone Encounter (Signed)
Patient requesting Rx refill for oxyCODONE-acetaminophen (PERCOCET/ROXICET) 5-325 MG per tablet.  Please call and advise.

## 2014-04-03 NOTE — Telephone Encounter (Signed)
Rx last prescribed 05/2013 at [facility].  Request forwarded to provider for approval.

## 2014-04-04 ENCOUNTER — Telehealth: Payer: Self-pay | Admitting: Neurology

## 2014-04-04 NOTE — Telephone Encounter (Signed)
Patient calling to state that she only had one refill of Percocet and wants to get another refill. Please return call to patient and advise.

## 2014-04-04 NOTE — Telephone Encounter (Signed)
Percocet is a CII, by law refills are not allowed.  A new written Rx is required for each fill.  I called the patient back.  She is aware.

## 2014-04-05 NOTE — Telephone Encounter (Signed)
Called pt and left message informing her that her Rx was ready to be picked up at the front desk and if she has and other problems, questions or concerns to call the office.

## 2014-04-17 ENCOUNTER — Encounter (HOSPITAL_COMMUNITY): Payer: Self-pay

## 2014-04-17 ENCOUNTER — Encounter (HOSPITAL_COMMUNITY)
Admission: RE | Admit: 2014-04-17 | Discharge: 2014-04-17 | Disposition: A | Discharge status: Home / Self Care | Payer: BC Managed Care – PPO | Source: Ambulatory Visit | Attending: Neurology | Admitting: Neurology

## 2014-04-17 VITALS — BP 103/63 | HR 70 | Temp 97.6°F | Resp 20

## 2014-04-17 DIAGNOSIS — G35 Multiple sclerosis: Secondary | ICD-10-CM | POA: Diagnosis present

## 2014-04-17 MED ORDER — ACETAMINOPHEN 500 MG PO TABS
1000.0000 mg | ORAL_TABLET | ORAL | Status: AC
  Administered 2014-04-17: 1000 mg via ORAL
  Filled 2014-04-17: qty 2

## 2014-04-17 MED ORDER — SODIUM CHLORIDE 0.9 % IV SOLN
300.0000 mg | INTRAVENOUS | Status: AC
  Administered 2014-04-17: 300 mg via INTRAVENOUS
  Filled 2014-04-17: qty 15

## 2014-04-17 MED ORDER — LORATADINE 10 MG PO TABS
10.0000 mg | ORAL_TABLET | ORAL | Status: AC
  Administered 2014-04-17: 10 mg via ORAL
  Filled 2014-04-17: qty 1

## 2014-04-17 MED ORDER — SODIUM CHLORIDE 0.9 % IV SOLN
INTRAVENOUS | Status: AC
  Administered 2014-04-17: 13:00:00 via INTRAVENOUS

## 2014-04-17 NOTE — Discharge Instructions (Signed)

## 2014-05-15 ENCOUNTER — Telehealth: Payer: Self-pay | Admitting: Neurology

## 2014-05-15 ENCOUNTER — Other Ambulatory Visit: Payer: Self-pay | Admitting: Neurology

## 2014-05-15 DIAGNOSIS — G35 Multiple sclerosis: Secondary | ICD-10-CM

## 2014-05-15 NOTE — Telephone Encounter (Signed)
I have put in order for Tysbari, previous JC-virus in March 2015 reported as indetermined, Hoyle Sauer, please recheck she C virus antibody in her followup visit in September 2015

## 2014-05-15 NOTE — Telephone Encounter (Signed)
[facility] called for Tysabri orders in Epic for patient.  Her appointment is tomorrow, 05-16-14.

## 2014-05-16 ENCOUNTER — Encounter (HOSPITAL_COMMUNITY): Payer: Self-pay

## 2014-05-16 ENCOUNTER — Encounter (HOSPITAL_COMMUNITY)
Admission: RE | Admit: 2014-05-16 | Discharge: 2014-05-16 | Disposition: A | Discharge status: Home / Self Care | Payer: BC Managed Care – PPO | Source: Ambulatory Visit | Attending: Neurology | Admitting: Neurology

## 2014-05-16 VITALS — BP 117/68 | HR 75 | Temp 97.9°F | Resp 20 | Ht 63.5 in | Wt 196.0 lb

## 2014-05-16 DIAGNOSIS — G35 Multiple sclerosis: Secondary | ICD-10-CM | POA: Insufficient documentation

## 2014-05-16 MED ORDER — ACETAMINOPHEN 500 MG PO TABS
1000.0000 mg | ORAL_TABLET | ORAL | Status: DC
  Administered 2014-05-16: 1000 mg via ORAL
  Filled 2014-05-16: qty 2

## 2014-05-16 MED ORDER — LORATADINE 10 MG PO TABS
10.0000 mg | ORAL_TABLET | ORAL | Status: DC
  Administered 2014-05-16 (×2): 10 mg via ORAL
  Filled 2014-05-16: qty 1

## 2014-05-16 MED ORDER — SODIUM CHLORIDE 0.9 % IV SOLN
INTRAVENOUS | Status: DC
  Administered 2014-05-16: 13:00:00 via INTRAVENOUS

## 2014-05-16 MED ORDER — SODIUM CHLORIDE 0.9 % IV SOLN
300.0000 mg | INTRAVENOUS | Status: DC
  Administered 2014-05-16: 300 mg via INTRAVENOUS
  Filled 2014-05-16: qty 15

## 2014-05-16 NOTE — Progress Notes (Signed)
Tysabri completed, pt. Stayed 10 minutes post- infusion, pt. To follow-up with doctor with any problems.

## 2014-05-16 NOTE — Discharge Instructions (Signed)

## 2014-05-23 ENCOUNTER — Ambulatory Visit (INDEPENDENT_AMBULATORY_CARE_PROVIDER_SITE_OTHER): Payer: BC Managed Care – PPO | Admitting: Nurse Practitioner

## 2014-05-23 ENCOUNTER — Encounter: Payer: Self-pay | Admitting: Nurse Practitioner

## 2014-05-23 VITALS — BP 139/88 | HR 86 | Ht 63.5 in | Wt 205.0 lb

## 2014-05-23 DIAGNOSIS — Z79899 Other long term (current) drug therapy: Secondary | ICD-10-CM

## 2014-05-23 DIAGNOSIS — G35 Multiple sclerosis: Secondary | ICD-10-CM

## 2014-05-23 LAB — CBC WITH DIFFERENTIAL
BASOS ABS: 0 10*3/uL (ref 0.0–0.2)
Basos: 0 %
EOS: 2 %
Eosinophils Absolute: 0.1 10*3/uL (ref 0.0–0.4)
HCT: 39 % (ref 34.0–46.6)
Hemoglobin: 13.5 g/dL (ref 11.1–15.9)
LYMPHS ABS: 3.4 10*3/uL — AB (ref 0.7–3.1)
Lymphs: 36 %
MCH: 30.1 pg (ref 26.6–33.0)
MCHC: 34.6 g/dL (ref 31.5–35.7)
MCV: 87 fL (ref 79–97)
MONOCYTES: 9 %
Monocytes Absolute: 0.9 10*3/uL (ref 0.1–0.9)
NEUTROS PCT: 53 %
Neutrophils Absolute: 5.1 10*3/uL (ref 1.4–7.0)
Platelets: 258 10*3/uL (ref 150–379)
RBC: 4.48 x10E6/uL (ref 3.77–5.28)
RDW: 13.8 % (ref 12.3–15.4)
WBC: 9.6 10*3/uL (ref 3.4–10.8)

## 2014-05-23 MED ORDER — SERTRALINE HCL 50 MG PO TABS: 50.0000 mg | ORAL_TABLET | Freq: Every day | ORAL | Status: DC

## 2014-05-23 NOTE — Progress Notes (Signed)
[facility]  PATIENT: Suzanne Robbins DOB: Jan 18, 1971   REASON FOR VISIT: Followup for multiple sclerosis   HISTORY OF PRESENT ILLNESS:Suzanne Robbins, 43 -year-old female returns for followup. She has a history of relapsing remitting multiple sclerosis, has been on Tysabri since December 2013. Last JC antibody was intermediate on March 2015. MRI Sept 2014 -Abnormal MRI scan the brain showing multiple periventricular, subcortical and pericallosal white matter hyperintensities typicalfor demyelinating disease. No enhancing lesions are noted. The presence of multiple T1 black holes andmild cortical atrophy indicates chronic disease. Overall nosignificant changes compared with MRI scan dated 07/13/2012 allowing for minor differences in the 2 scanners.  She complains with occasional fatigue and occasional headache. She denies any falls, any sensory changes, any problems with her vision, speech or swallowing problems, loss of bowel or bladder control. 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 She returns for reevaluation.    HISTORY: In August 2013, she noticed numbness of her left foot, intermittent, faded away in 2 weeks  In September 2013, she noticed left arm numbness, itching underneath her skin, lasting for 2 weeks  In October 2013, she began to notice unbalanced gait, dizziness, progressively worse, woke up one morning noticed double vision, worsening balance difficulty, binocular diplopia, spinning sensation, profound gait difficulty, fatigue, nausea as, as  She was admitted to [facility], July 13 2012 .  MRI brain ([place]) showed numerous white matter lesions which are predominately periventricular location suspicious for chronic Suzanne, with suspected acute demyelinating lesion in the right temporal lobe periventricular optic radiation. Mild premature atrophy. No contrast was given. MRA brain was normal.  [facility], Jul 27, 2012 MRI of the brain  showed Multiple chronic periventricular and subcortical chronic demyelinating plaques.There are 3 enhancing lesions likely acute demyelinating plaques in the right periatrial, left posterior frontal and left occipital regions  MRI cervical spine showed Multiple chronic demyelinating plaques at C2-3, C3, C4, C5-6, and C7. No contrast enhancement  Laboratory evaluation showed normal ESR 10, CMP, CBC, negative Lyme titer, acetylcholine receptor antibody, ANA, TSH,  CSF, WBC 3, RBC 12, negative cryptococcal antigen, 5 oligoclonal bands, total protein 29, glucose 65  JC virus antibody was negative in Jul 19, 2102.  VEP 07/25/2012: was within normal limits bilaterally. No evidence of conduction slowing was seen within the anterior visual pathways on either side by today's evaluation.  She received IV steroid following diagnosis of multiple sclerosis with significant improvement of her symptoms,  She had Tysarbri since 12 2013, had 4 treatment so far, no side effect, at [facility] shortstay, 3pm, takes 2-3 hours, infusion took one hour, observe for one hour after infusion, symptoms have improved, right leg has heat inside, feeling sun burn, no gait difficulty, no incontinence, no visual difficulty.  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 She lives with her daughter 35, driving no difficulty.  She is doing very well now, no gait difficulty, no visual loss, occasionally headaches, resolved by Percocet, she is working full-time, no difficulty handling her job, still taking Zoloft 50 mg for mild depression.     REVIEW OF SYSTEMS: Full 14 system review of systems performed and notable only for those listed, all others are neg:  Constitutional: N/A  Cardiovascular: N/A  Ear/Nose/Throat: N/A  Skin: N/A  Eyes: N/A  Respiratory: N/A  Gastroitestinal: N/A  Hematology/Lymphatic: N/A  Endocrine: N/A Musculoskeletal:N/A  Allergy/Immunology: N/A  Neurological: N/A Psychiatric: N/A Sleep :  NA  ALLERGIES: No Known Allergies  HOME MEDICATIONS: Outpatient Prescriptions Prior to Visit  Medication Sig Dispense Refill  . estradiol (ESTRACE) 2 MG tablet Take 2 mg by mouth daily.      Marland Kitchen ibuprofen (ADVIL,MOTRIN) 200 MG tablet Take 400 mg by mouth every 6 (six) hours as needed. For pain      . oxyCODONE-acetaminophen (PERCOCET/ROXICET) 5-325 MG per tablet Take 1 tablet by mouth every 4 (four) hours as needed.  60 tablet  0  . sertraline (ZOLOFT) 50 MG tablet Take 1 tablet (50 mg total) by mouth daily.  30 tablet  12   No facility-administered medications prior to visit.    PAST MEDICAL HISTORY: Past Medical History  Diagnosis Date  . Uterine cancer 2000  . Suzanne (multiple sclerosis) 07/13/12    PAST SURGICAL HISTORY: Past Surgical History  Procedure Laterality Date  . Abdominal hysterectomy  2000    FAMILY HISTORY: Family History  Problem Relation Age of Onset  . Lung cancer Father     SOCIAL HISTORY: History   Social History  . Marital Status: Legally Separated    Spouse Name: N/A    Number of Children: 2  . Years of Education: 14   Occupational History  . banker   .     Social History Main Topics  . Smoking status: Never Smoker   . Smokeless tobacco: Never Used  . Alcohol Use: No  . Drug Use: No  . Sexual Activity: Not on file   Other Topics Concern  . Not on file   Social History Narrative   Patient lives at home with her daughter. Patient has a two years of college education.   Right handed.   Caffeine- two sodas daily.   Patient is separated.      PHYSICAL EXAM  Filed Vitals:   05/23/14 0901  BP: 139/88  Pulse: 86  Height: 5' 3.5" (1.613 m)  Weight: 205 lb (92.987 kg)   Body mass index is 35.74 kg/(m^2). Generalized: Well developed, obese female in no acute distress  Head: normocephalic and atraumatic,. Oropharynx benign  Neck: Supple, no carotid bruits  Cardiac: Regular rate rhythm, no murmur  Musculoskeletal: No deformity   Neurological examination  Mentation: Alert oriented to time, place, history taking. Follows all commands speech and language fluent  Cranial nerve II-XII: Fundoscopic exam reveals sharp disc margins.Left afferent pupillary defect Pupils were equal round reactive to light extraocular movements were full, visual field were full on confrontational test. Visual acuity 20/30 right, 20/30 left .Facial sensation and strength were normal. hearing was intact to finger rubbing bilaterally. Uvula tongue midline. head turning and shoulder shrug were normal and symmetric.Tongue protrusion into cheek strength was normal.  Motor: normal bulk and tone, full strength in the BUE, BLE, fine finger movements normal, no pronator drift. No focal weakness  Sensory: normal and symmetric to light touch, pinprick, and vibration  Coordination: finger-nose-finger, heel-to-shin bilaterally, no dysmetria  Reflexes: Brachioradialis 2/2, biceps 2/2, triceps 2/2, patellar 3/3, Achilles 2/2, plantar responses were flexor bilaterally.  Gait and Station: Rising up from seated position without assistance, normal stance, moderate stride, good arm swing, smooth turning, able to perform tiptoe, and heel walking without difficulty. Tandem gait is steady.     DIAGNOSTIC DATA (LABS, IMAGING, TESTING) - I reviewed patient records, labs, notes, testing and imaging myself where available.  Lab Results  Component Value Date   WBC 9.5 11/20/2013   HGB 12.8 11/20/2013   HCT 36.9 11/20/2013   MCV  86 11/20/2013   PLT 255 07/14/2012    ASSESSMENT AND PLAN  43 y.o. year old female  has a past medical history of Uterine cancer (2000) and Suzanne (multiple sclerosis) (07/13/12). here to followup. Patient is currently on Tysabri monthly. Discussed with Dr. Krista Blue Patient to continue Tysabri CBC and  JC virus antibody today MRI of the brain and compare 2014 September Followup in 3 months Next visit with  Dr. Luan Pulling, [facility], [facility],  [facility] [address], [place], [place] [address] [phone]

## 2014-05-23 NOTE — Patient Instructions (Signed)
Patient to continue Tysabri CBC and  JC virus antibody today MRI of the brain and compare 2014 September Followup in 3 months Next visit with  Dr. Krista Blue

## 2014-05-25 ENCOUNTER — Telehealth: Payer: Self-pay | Admitting: *Deleted

## 2014-05-25 NOTE — Telephone Encounter (Signed)
Normal labs. Patient aware

## 2014-05-30 ENCOUNTER — Telehealth: Payer: Self-pay | Admitting: Nurse Practitioner

## 2014-05-30 NOTE — Telephone Encounter (Signed)
Antibody was negative. Please call patient

## 2014-05-31 ENCOUNTER — Ambulatory Visit (INDEPENDENT_AMBULATORY_CARE_PROVIDER_SITE_OTHER): Payer: BC Managed Care – PPO

## 2014-05-31 DIAGNOSIS — G35 Multiple sclerosis: Secondary | ICD-10-CM

## 2014-05-31 NOTE — Telephone Encounter (Signed)
Called patient and left voice message of negative JC Antibody, and to call back with any questions or concerns.

## 2014-06-01 MED ORDER — GADOPENTETATE DIMEGLUMINE 469.01 MG/ML IV SOLN: 20.0000 mL | Freq: Once | INTRAVENOUS | Status: AC | PRN

## 2014-06-13 ENCOUNTER — Encounter (HOSPITAL_COMMUNITY): Payer: Self-pay

## 2014-06-13 ENCOUNTER — Encounter (HOSPITAL_COMMUNITY)
Admission: RE | Admit: 2014-06-13 | Discharge: 2014-06-13 | Disposition: A | Discharge status: Home / Self Care | Payer: BC Managed Care – PPO | Source: Ambulatory Visit | Attending: Neurology | Admitting: Neurology

## 2014-06-13 VITALS — BP 111/73 | HR 74 | Temp 98.2°F | Resp 20

## 2014-06-13 DIAGNOSIS — G35 Multiple sclerosis: Secondary | ICD-10-CM | POA: Diagnosis present

## 2014-06-13 MED ORDER — SODIUM CHLORIDE 0.9 % IV SOLN
300.0000 mg | INTRAVENOUS | Status: DC
  Administered 2014-06-13: 300 mg via INTRAVENOUS
  Filled 2014-06-13: qty 15

## 2014-06-13 MED ORDER — SODIUM CHLORIDE 0.9 % IV SOLN
INTRAVENOUS | Status: DC
  Administered 2014-06-13: 13:00:00 via INTRAVENOUS

## 2014-06-13 MED ORDER — LORATADINE 10 MG PO TABS
10.0000 mg | ORAL_TABLET | ORAL | Status: AC
  Administered 2014-06-13: 10 mg via ORAL
  Filled 2014-06-13: qty 1

## 2014-06-13 MED ORDER — ACETAMINOPHEN 500 MG PO TABS
1000.0000 mg | ORAL_TABLET | ORAL | Status: DC
  Administered 2014-06-13: 1000 mg via ORAL
  Filled 2014-06-13: qty 2

## 2014-06-13 NOTE — Discharge Instructions (Signed)

## 2014-06-13 NOTE — Progress Notes (Signed)
Post-Infusion Tysabri, pt. Stayed 15 minutes post-infusion, pt. To follow-up with doctor with any problems. 

## 2014-07-11 ENCOUNTER — Encounter (HOSPITAL_COMMUNITY): Payer: Self-pay

## 2014-07-11 ENCOUNTER — Encounter (HOSPITAL_COMMUNITY)
Admission: RE | Admit: 2014-07-11 | Discharge: 2014-07-11 | Disposition: A | Discharge status: Home / Self Care | Payer: BC Managed Care – PPO | Source: Ambulatory Visit | Attending: Neurology | Admitting: Neurology

## 2014-07-11 VITALS — BP 127/87 | HR 70 | Temp 98.0°F | Resp 18

## 2014-07-11 DIAGNOSIS — G35 Multiple sclerosis: Secondary | ICD-10-CM | POA: Insufficient documentation

## 2014-07-11 MED ORDER — SODIUM CHLORIDE 0.9 % IV SOLN
300.0000 mg | INTRAVENOUS | Status: AC
  Administered 2014-07-11: 300 mg via INTRAVENOUS
  Filled 2014-07-11: qty 15

## 2014-07-11 MED ORDER — LORATADINE 10 MG PO TABS
10.0000 mg | ORAL_TABLET | ORAL | Status: AC
  Administered 2014-07-11: 10 mg via ORAL
  Filled 2014-07-11: qty 1

## 2014-07-11 MED ORDER — ACETAMINOPHEN 500 MG PO TABS
1000.0000 mg | ORAL_TABLET | ORAL | Status: AC
  Administered 2014-07-11: 1000 mg via ORAL
  Filled 2014-07-11: qty 2

## 2014-07-11 MED ORDER — SODIUM CHLORIDE 0.9 % IV SOLN
INTRAVENOUS | Status: AC
  Administered 2014-07-11: 14:00:00 via INTRAVENOUS

## 2014-07-11 NOTE — Progress Notes (Signed)
Patient stayed 20 minutes post Tysabri infusion. Declined to stay full hour for observation. Aware to call MD if any problems.

## 2014-08-08 ENCOUNTER — Encounter (HOSPITAL_COMMUNITY): Payer: Self-pay

## 2014-08-08 ENCOUNTER — Encounter (HOSPITAL_COMMUNITY)
Admission: RE | Admit: 2014-08-08 | Discharge: 2014-08-08 | Disposition: A | Discharge status: Home / Self Care | Payer: BC Managed Care – PPO | Source: Ambulatory Visit | Attending: Neurology | Admitting: Neurology

## 2014-08-08 VITALS — BP 109/66 | HR 69 | Temp 98.3°F | Resp 18 | Ht 63.5 in | Wt 196.0 lb

## 2014-08-08 DIAGNOSIS — G35 Multiple sclerosis: Secondary | ICD-10-CM | POA: Diagnosis not present

## 2014-08-08 MED ORDER — SODIUM CHLORIDE 0.9 % IV SOLN
INTRAVENOUS | Status: AC
  Administered 2014-08-08: 250 mL via INTRAVENOUS

## 2014-08-08 MED ORDER — SODIUM CHLORIDE 0.9 % IV SOLN
300.0000 mg | INTRAVENOUS | Status: AC
  Administered 2014-08-08: 300 mg via INTRAVENOUS
  Filled 2014-08-08: qty 15

## 2014-08-08 MED ORDER — LORATADINE 10 MG PO TABS
10.0000 mg | ORAL_TABLET | ORAL | Status: AC
  Administered 2014-08-08: 10 mg via ORAL
  Filled 2014-08-08: qty 1

## 2014-08-08 MED ORDER — ACETAMINOPHEN 500 MG PO TABS
1000.0000 mg | ORAL_TABLET | ORAL | Status: AC
  Administered 2014-08-08: 1000 mg via ORAL
  Filled 2014-08-08: qty 2

## 2014-08-23 ENCOUNTER — Encounter: Payer: Self-pay | Admitting: Neurology

## 2014-08-23 ENCOUNTER — Ambulatory Visit (INDEPENDENT_AMBULATORY_CARE_PROVIDER_SITE_OTHER): Payer: BC Managed Care – PPO | Admitting: Neurology

## 2014-08-23 VITALS — BP 123/75 | HR 81 | Ht 63.5 in | Wt 205.0 lb

## 2014-08-23 DIAGNOSIS — G35 Multiple sclerosis: Secondary | ICD-10-CM

## 2014-08-23 MED ORDER — MODAFINIL 100 MG PO TABS: 100.0000 mg | ORAL_TABLET | Freq: Two times a day (BID) | ORAL | Status: DC

## 2014-08-23 NOTE — Progress Notes (Addendum)
[facility]  PATIENT: Suzanne Robbins DOB: [birth year]   REASON FOR VISIT: Followup for multiple sclerosis   HISTORY OF PRESENT ILLNESS: Suzanne Robbins, 43 -year-old female with history of relapsing remitting multiple sclerosis   In August 2013, she noticed numbness of her left foot, intermittent, faded away in 2 weeks  In September 2013, she noticed left arm numbness, itching underneath her skin, lasting for 2 weeks  In October 2013, she began to notice unbalanced gait, dizziness, progressively worse, woke up one morning noticed double vision, worsening balance difficulty, binocular diplopia, spinning sensation, profound gait difficulty, fatigue, nausea   She was admitted to [facility], July 13 2012 .  MRI brain ([place]) showed numerous white matter lesions which are predominately periventricular location suspicious for chronic Suzanne, with suspected acute demyelinating lesion in the right temporal lobe periventricular optic radiation. Mild premature atrophy. No contrast was given. MRA brain was normal.  [facility], Jul 27, 2012 MRI of the brain showed Multiple chronic periventricular and subcortical chronic demyelinating plaques.There are 3 enhancing lesions likely acute demyelinating plaques in the right periatrial, left posterior frontal and left occipital regions  MRI cervical spine showed Multiple chronic demyelinating plaques at C2-3, C3, C4, C5-6, and C7. No contrast enhancement   Laboratory evaluation showed normal ESR 10, CMP, CBC, negative Lyme titer, acetylcholine receptor antibody, ANA, TSH,   CSF, WBC 3, RBC 12, negative cryptococcal antigen, 5 oligoclonal bands, total protein 29, glucose 65  VEP 07/25/2012: was within normal limits bilaterally. No evidence of conduction slowing was seen within the anterior visual pathways  Tysabri since December 2013.  JC virus antibody was negative in Jul 19, 2102. C antibody was intermediate on March 2015. 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   UPDATE Dec 17th 2015: She has word finding difficulty, urinary urgency, fatigue. She deines trouble walkings, overall her symptoms has been stable, tolerating [name],   Most recent repeat MRI September 2015: numerous periventricular and subcortical white matter hyperintensities compatible with chronic multiple sclerosis. No enhancing lesions are noted. The presence of several T1 black holes and mild generalized atrophy indicates chronic disease. Compared with previous MRI scan dated 07/13/2012 there appears to be minimum progression of disease activity  REVIEW OF SYSTEMS: Full 14 system review of systems performed and notable only for those listed, all others are neg:  Urinary urgency, fatigue, decreased concentration  ALLERGIES: No Known Allergies  HOME MEDICATIONS: Outpatient Prescriptions Prior to Visit  Medication Sig Dispense Refill  . estradiol (ESTRACE) 2 MG tablet Take 2 mg by mouth daily.    Marland Kitchen ibuprofen (ADVIL,MOTRIN) 200 MG tablet Take 400 mg by mouth every 6 (six) hours as needed. For pain    . natalizumab 300 mg in sodium chloride 0.9 % 100 mL Inject 300 mg into the vein every 28 (twenty-eight) days.    Marland Kitchen oxyCODONE-acetaminophen (PERCOCET/ROXICET) 5-325 MG per tablet Take 1 tablet by mouth every 4 (four) hours as needed. 60 tablet 0  . sertraline (ZOLOFT) 50 MG tablet Take 1 tablet (50 mg total) by mouth daily. 30 tablet 12   No facility-administered medications prior to visit.    PAST MEDICAL HISTORY: Past Medical History  Diagnosis Date  . Uterine cancer 2000  . Suzanne (multiple sclerosis) 07/13/12    PAST SURGICAL HISTORY: Past Surgical History  Procedure Laterality Date  . Abdominal hysterectomy  2000  FAMILY HISTORY: Family History  Problem Relation Age of Onset  . Lung cancer Father     SOCIAL HISTORY: History   Social History  . Marital Status:  Legally Separated    Spouse Name: N/A    Number of Children: 2  . Years of Education: 14   Occupational History  . banker   .     Social History Main Topics  . Smoking status: Never Smoker   . Smokeless tobacco: Never Used  . Alcohol Use: No  . Drug Use: No  . Sexual Activity: Not on file   Other Topics Concern  . Not on file   Social History Narrative   Patient lives at home with her daughter. Patient has a two years of college education.   Right handed.   Caffeine- two sodas daily.   Patient is separated.      PHYSICAL EXAM  Filed Vitals:   08/23/14 0828  BP: 123/75  Pulse: 81  Height: 5' 3.5" (1.613 m)  Weight: 205 lb (92.987 kg)   Body mass index is 35.74 kg/(m^2). Generalized: Well developed, obese female in no acute distress  Head: normocephalic and atraumatic,. Oropharynx benign  Neck: Supple, no carotid bruits  Cardiac: Regular rate rhythm, no murmur  Musculoskeletal: No deformity  Neurological examination  Mentation: Alert oriented to time, place, history taking. Follows all commands speech and language fluent  Cranial nerve II-XII: Fundoscopic exam reveals sharp disc margins.Left afferent pupillary defect Pupils were equal round reactive to light extraocular movements were full, visual field were full on confrontational test. Visual acuity 20/30 right, 20/30 left .Facial sensation and strength were normal. hearing was intact to finger rubbing bilaterally. Uvula tongue midline. head turning and shoulder shrug were normal and symmetric.Tongue protrusion into cheek strength was normal.  Motor: normal bulk and tone, full strength in the BUE, BLE, fine finger movements normal, no pronator drift. No focal weakness  Sensory: normal and symmetric to light touch, pinprick, and vibration  Coordination: finger-nose-finger, heel-to-shin bilaterally, no dysmetria  Reflexes: Brachioradialis 2/2, biceps 2/2, triceps 2/2, patellar 3/3, Achilles 2/2, plantar responses were  flexor bilaterally.  Gait and Station: Rising up from seated position without assistance, normal stance, moderate stride, good arm swing, smooth turning, able to perform tiptoe, and heel walking without difficulty. Tandem gait is steady.     DIAGNOSTIC DATA (LABS, IMAGING, TESTING) - I reviewed patient records, labs, notes, testing and imaging myself where available.  Lab Results  Component Value Date   WBC 9.6 05/23/2014   HGB 13.5 05/23/2014   HCT 39.0 05/23/2014   MCV 87 05/23/2014   PLT 258 05/23/2014    ASSESSMENT AND PLAN  43 y.o. year old with relapsing remitting multiple sclerosis, diagnosis was made in November 2013, Tysarbri since December 2013, JC virus antibody was negative,  1. continue Tysabri 2. JC virus antibody every 3 months, MRI of the brain every 6 months 3. return to clinic in 3 months 4, Provigil 100 mg twice a day for fatigue.   Marcial Pacas, M.D. Ph.D.  [facility] [place], [place] [address] Phone: [phone] Fax:      [phone]

## 2014-08-24 ENCOUNTER — Telehealth: Payer: Self-pay | Admitting: Neurology

## 2014-08-24 NOTE — Telephone Encounter (Signed)
I called the pharmacy to obtain RX ins info so we may contact them to initiate PA.  They said the number to call is [phone].  ID # [id number].  I contacted ins and provided clinical info.  The request is under review.  They will notify patient of outcome once a decision has been reached.  I called the patient back.  She is aware.

## 2014-08-24 NOTE — Telephone Encounter (Signed)
Patient is calling because she went yesterday to get Rx generic Provigil filled and was told by the pharmacy that a pre authorization is required. Please call patient and advise. Thank you.

## 2014-09-04 ENCOUNTER — Telehealth: Payer: Self-pay | Admitting: Neurology

## 2014-09-04 ENCOUNTER — Other Ambulatory Visit: Payer: Self-pay | Admitting: Neurology

## 2014-09-04 DIAGNOSIS — G35 Multiple sclerosis: Secondary | ICD-10-CM

## 2014-09-04 NOTE — Telephone Encounter (Signed)
Dee @ [facility] just calling to make sure you check your inbox.  She states patient will be at [facility] early Wednesday morning for Tysbari and she needs the orders to be put in.

## 2014-09-04 NOTE — Telephone Encounter (Signed)
Orders have been place by Dr. Krista Blue.

## 2014-09-04 NOTE — Telephone Encounter (Signed)
Dr. Krista Blue I received a staff message from [facility] and they need Tysabri orders placed in Epic.  Patient's appointment is tomorrow 09-05-14.

## 2014-09-04 NOTE — Telephone Encounter (Signed)
I have put in order for [facility]

## 2014-09-05 ENCOUNTER — Encounter (HOSPITAL_COMMUNITY): Payer: Self-pay

## 2014-09-05 ENCOUNTER — Encounter (HOSPITAL_COMMUNITY)
Admission: RE | Admit: 2014-09-05 | Discharge: 2014-09-05 | Disposition: A | Discharge status: Home / Self Care | Payer: BC Managed Care – PPO | Source: Ambulatory Visit | Attending: Neurology | Admitting: Neurology

## 2014-09-05 VITALS — BP 113/76 | HR 65 | Temp 98.0°F | Resp 20 | Ht 63.5 in | Wt 205.0 lb

## 2014-09-05 DIAGNOSIS — G35 Multiple sclerosis: Secondary | ICD-10-CM

## 2014-09-05 MED ORDER — ACETAMINOPHEN 500 MG PO TABS
1000.0000 mg | ORAL_TABLET | ORAL | Status: DC
  Administered 2014-09-05: 1000 mg via ORAL
  Filled 2014-09-05: qty 2

## 2014-09-05 MED ORDER — SODIUM CHLORIDE 0.9 % IV SOLN
INTRAVENOUS | Status: DC
  Administered 2014-09-05: 13:00:00 via INTRAVENOUS

## 2014-09-05 MED ORDER — LORATADINE 10 MG PO TABS
10.0000 mg | ORAL_TABLET | ORAL | Status: DC
  Administered 2014-09-05: 10 mg via ORAL
  Filled 2014-09-05: qty 1

## 2014-09-05 MED ORDER — SODIUM CHLORIDE 0.9 % IV SOLN
300.0000 mg | INTRAVENOUS | Status: DC
  Administered 2014-09-05: 300 mg via INTRAVENOUS
  Filled 2014-09-05: qty 15

## 2014-09-05 NOTE — Discharge Instructions (Signed)

## 2014-09-11 ENCOUNTER — Telehealth: Payer: Self-pay | Admitting: Neurology

## 2014-09-11 NOTE — Telephone Encounter (Signed)
Suzanne Robbins with [facility] is calling regarding a re-authorization questionnaire which validates patients healthcare that needs to be filled out. States she will fax request.

## 2014-09-13 NOTE — Telephone Encounter (Signed)
Form has been faxed to [facility].

## 2014-10-03 ENCOUNTER — Encounter (HOSPITAL_COMMUNITY)
Admission: RE | Admit: 2014-10-03 | Discharge: 2014-10-03 | Disposition: A | Discharge status: Home / Self Care | Payer: BLUE CROSS/BLUE SHIELD | Source: Ambulatory Visit | Attending: Neurology | Admitting: Neurology

## 2014-10-03 ENCOUNTER — Encounter (HOSPITAL_COMMUNITY): Payer: Self-pay

## 2014-10-03 VITALS — BP 141/73 | HR 75 | Temp 98.1°F | Resp 20 | Ht 63.5 in | Wt 205.0 lb

## 2014-10-03 DIAGNOSIS — G35 Multiple sclerosis: Secondary | ICD-10-CM | POA: Diagnosis present

## 2014-10-03 MED ORDER — LORATADINE 10 MG PO TABS
10.0000 mg | ORAL_TABLET | ORAL | Status: DC
  Administered 2014-10-03: 10 mg via ORAL
  Filled 2014-10-03: qty 1

## 2014-10-03 MED ORDER — SODIUM CHLORIDE 0.9 % IV SOLN
INTRAVENOUS | Status: DC
  Administered 2014-10-03: 13:00:00 via INTRAVENOUS

## 2014-10-03 MED ORDER — ACETAMINOPHEN 500 MG PO TABS
1000.0000 mg | ORAL_TABLET | ORAL | Status: DC
  Administered 2014-10-03: 1000 mg via ORAL
  Filled 2014-10-03: qty 2

## 2014-10-03 MED ORDER — SODIUM CHLORIDE 0.9 % IV SOLN
300.0000 mg | INTRAVENOUS | Status: DC
  Administered 2014-10-03: 300 mg via INTRAVENOUS
  Filled 2014-10-03: qty 15

## 2014-10-03 NOTE — Discharge Instructions (Signed)

## 2014-10-31 ENCOUNTER — Encounter (HOSPITAL_COMMUNITY)
Admission: RE | Admit: 2014-10-31 | Discharge: 2014-10-31 | Disposition: A | Discharge status: Home / Self Care | Payer: BLUE CROSS/BLUE SHIELD | Source: Ambulatory Visit | Attending: Neurology | Admitting: Neurology

## 2014-10-31 ENCOUNTER — Encounter (HOSPITAL_COMMUNITY): Payer: Self-pay

## 2014-10-31 VITALS — BP 132/78 | HR 71 | Temp 97.7°F | Resp 18 | Ht 63.5 in | Wt 205.0 lb

## 2014-10-31 DIAGNOSIS — G35 Multiple sclerosis: Secondary | ICD-10-CM | POA: Diagnosis present

## 2014-10-31 MED ORDER — ACETAMINOPHEN 500 MG PO TABS
1000.0000 mg | ORAL_TABLET | ORAL | Status: AC
  Administered 2014-10-31: 1000 mg via ORAL
  Filled 2014-10-31: qty 2

## 2014-10-31 MED ORDER — SODIUM CHLORIDE 0.9 % IV SOLN
INTRAVENOUS | Status: AC
  Administered 2014-10-31: 250 mL via INTRAVENOUS

## 2014-10-31 MED ORDER — SODIUM CHLORIDE 0.9 % IV SOLN
300.0000 mg | INTRAVENOUS | Status: AC
  Administered 2014-10-31: 300 mg via INTRAVENOUS
  Filled 2014-10-31: qty 15

## 2014-10-31 MED ORDER — LORATADINE 10 MG PO TABS
10.0000 mg | ORAL_TABLET | ORAL | Status: AC
  Administered 2014-10-31: 10 mg via ORAL
  Filled 2014-10-31: qty 1

## 2014-10-31 NOTE — Progress Notes (Signed)
Stayed 30 min post infusion. Unable remain full hour. No immediate problems post tysabri. Knows To call MD if problem arises

## 2014-11-19 ENCOUNTER — Ambulatory Visit: Payer: BC Managed Care – PPO | Admitting: Neurology

## 2014-11-28 ENCOUNTER — Encounter (HOSPITAL_COMMUNITY)
Admission: RE | Admit: 2014-11-28 | Discharge: 2014-11-28 | Disposition: A | Discharge status: Home / Self Care | Payer: BLUE CROSS/BLUE SHIELD | Source: Ambulatory Visit | Attending: Neurology | Admitting: Neurology

## 2014-11-28 ENCOUNTER — Encounter (HOSPITAL_COMMUNITY): Payer: Self-pay

## 2014-11-28 VITALS — BP 113/65 | HR 80 | Temp 98.1°F | Resp 19 | Ht 63.5 in | Wt 205.0 lb

## 2014-11-28 DIAGNOSIS — G35 Multiple sclerosis: Secondary | ICD-10-CM | POA: Diagnosis present

## 2014-11-28 MED ORDER — ACETAMINOPHEN 500 MG PO TABS
1000.0000 mg | ORAL_TABLET | ORAL | Status: AC
  Administered 2014-11-28: 1000 mg via ORAL
  Filled 2014-11-28: qty 2

## 2014-11-28 MED ORDER — LORATADINE 10 MG PO TABS
10.0000 mg | ORAL_TABLET | ORAL | Status: AC
  Administered 2014-11-28: 10 mg via ORAL
  Filled 2014-11-28: qty 1

## 2014-11-28 MED ORDER — SODIUM CHLORIDE 0.9 % IV SOLN
INTRAVENOUS | Status: AC
  Administered 2014-11-28: 250 mL via INTRAVENOUS

## 2014-11-28 MED ORDER — SODIUM CHLORIDE 0.9 % IV SOLN
300.0000 mg | INTRAVENOUS | Status: AC
  Administered 2014-11-28: 300 mg via INTRAVENOUS
  Filled 2014-11-28: qty 15

## 2014-11-28 NOTE — Progress Notes (Signed)
Pt left shortly after infusion finished (did not stay for post-infusion observation)

## 2014-11-30 ENCOUNTER — Telehealth: Payer: Self-pay | Admitting: Neurology

## 2014-11-30 NOTE — Telephone Encounter (Signed)
Rx. for Tysabri faxed back to [facility], fax # [phone].  Fax confirmation received/fim

## 2014-11-30 NOTE — Telephone Encounter (Signed)
Judeen Hammans from [facility] is calling requesting a Rx for Osvaldo Shipper for pt to be faxed.  She faxed over paperwork yesterday to the Attn: Sharyn Lull.  Please call and advise [phone] ext. [phone].

## 2014-12-20 ENCOUNTER — Telehealth: Payer: Self-pay | Admitting: Neurology

## 2014-12-20 NOTE — Telephone Encounter (Signed)
Left message to return my call - need to schedule a follow up appt for her.

## 2014-12-20 NOTE — Telephone Encounter (Signed)
[name]: please give her a follow up appt with me

## 2014-12-20 NOTE — Telephone Encounter (Signed)
Appt scheduled for 01/01/15 - pt aware of time.

## 2014-12-26 ENCOUNTER — Encounter (HOSPITAL_COMMUNITY): Admission: RE | Admit: 2014-12-26 | Payer: BLUE CROSS/BLUE SHIELD | Source: Ambulatory Visit

## 2015-01-01 ENCOUNTER — Encounter: Payer: Self-pay | Admitting: Neurology

## 2015-01-01 ENCOUNTER — Ambulatory Visit (INDEPENDENT_AMBULATORY_CARE_PROVIDER_SITE_OTHER): Payer: BLUE CROSS/BLUE SHIELD | Admitting: Neurology

## 2015-01-01 VITALS — BP 125/77 | HR 94 | Ht 63.5 in | Wt 203.0 lb

## 2015-01-01 DIAGNOSIS — G35 Multiple sclerosis: Secondary | ICD-10-CM | POA: Diagnosis not present

## 2015-01-01 NOTE — Progress Notes (Signed)
[facility]  PATIENT: Suzanne Robbins DOB: July 11, 1971   REASON FOR VISIT: Followup for multiple sclerosis   HISTORY OF PRESENT ILLNESS: [name], 44 -year-old female with history of relapsing remitting multiple sclerosis   In August 2013, she noticed numbness of her left foot, intermittent, faded away in 2 weeks  In September 2013, she noticed left arm numbness, itching underneath her skin, lasting for 2 weeks  In October 2013, she began to notice unbalanced gait, dizziness, progressively worse, woke up one morning noticed double vision, worsening balance difficulty, binocular diplopia, spinning sensation, profound gait difficulty, fatigue, nausea   She was admitted to [facility], July 13 2012 .  MRI brain ([place]) showed numerous white matter lesions which are predominately periventricular location suspicious for chronic MS, with suspected acute demyelinating lesion in the right temporal lobe periventricular optic radiation. Mild premature atrophy. No contrast was given. MRA brain was normal.  [facility], Jul 27, 2012 MRI of the brain showed Multiple chronic periventricular and subcortical chronic demyelinating plaques.There are 3 enhancing lesions likely acute demyelinating plaques in the right periatrial, left posterior frontal and left occipital regions  MRI cervical spine showed Multiple chronic demyelinating plaques at C2-3, C3, C4, C5-6, and C7. No contrast enhancement   Laboratory evaluation showed normal ESR 10, CMP, CBC, negative Lyme titer, acetylcholine receptor antibody, ANA, TSH,   CSF, WBC 3, RBC 12, negative cryptococcal antigen, 5 oligoclonal bands, total protein 29, glucose 65  VEP 07/25/2012: was within normal limits bilaterally. No evidence of conduction slowing was seen within the anterior visual pathways  Tysabri since December 2013.  JC virus antibody was negative in Jul 19, 2102. C antibody was intermediate on March 2015. 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   UPDATE Dec 17th 2015: She has word finding difficulty, urinary urgency, fatigue. She deines trouble walkings, overall her symptoms has been stable, tolerating [name],   Most recent repeat MRI September 2015: numerous periventricular and subcortical white matter hyperintensities compatible with chronic multiple sclerosis. No enhancing lesions are noted. The presence of several T1 black holes and mild generalized atrophy indicates chronic disease. Compared with previous MRI scan dated 07/13/2012 there appears to be minimum progression of disease activity  UPDATE Narissa 26th 2016: She is now taking Provigil 159m bid, has helped her fatigue, still works full times, no blurry vision, no gait difficulty, she continues to receive Tysarbri IV infusion every one month, works well for her, no MS flareup   REVIEW OF SYSTEMS: Full 14 system review of systems performed and notable only for those listed, all others are neg:  Urinary urgency, fatigue, decreased concentration  ALLERGIES: No Known Allergies  HOME MEDICATIONS: Outpatient Prescriptions Prior to Visit  Medication Sig Dispense Refill  . estradiol (ESTRACE) 2 MG tablet Take 2 mg by mouth daily.    .Marland Kitchenibuprofen (ADVIL,MOTRIN) 200 MG tablet Take 400 mg by mouth every 6 (six) hours as needed. For pain    . modafinil (PROVIGIL) 100 MG tablet Take 1 tablet (100 mg total) by mouth 2 (two) times daily. 60 tablet 5  . natalizumab 300 mg in sodium chloride 0.9 % 100 mL Inject 300 mg into the vein every 28 (twenty-eight) days.    .Marland KitchenoxyCODONE-acetaminophen (PERCOCET/ROXICET) 5-325 MG per tablet Take 1 tablet by mouth every 4 (four) hours as needed. 60 tablet 0  . sertraline (ZOLOFT) 50 MG  tablet Take 1 tablet (50 mg total) by mouth daily. 30 tablet 12   No facility-administered medications prior to visit.    PAST MEDICAL HISTORY: Past Medical  History  Diagnosis Date  . Uterine cancer 2000  . MS (multiple sclerosis) 07/13/12    PAST SURGICAL HISTORY: Past Surgical History  Procedure Laterality Date  . Abdominal hysterectomy  2000    FAMILY HISTORY: Family History  Problem Relation Age of Onset  . Lung cancer Father     SOCIAL HISTORY: History   Social History  . Marital Status: Legally Separated    Spouse Name: N/A  . Number of Children: 2  . Years of Education: 14   Occupational History  . banker   .     Social History Main Topics  . Smoking status: Never Smoker   . Smokeless tobacco: Never Used  . Alcohol Use: No  . Drug Use: No  . Sexual Activity: Not on file   Other Topics Concern  . Not on file   Social History Narrative   Patient lives at home with her daughter. Patient has a two years of college education.   Right handed.   Caffeine- two sodas daily.   Patient is separated.      PHYSICAL EXAM  PHYSICAL EXAMNIATION:  Gen: NAD, conversant, well nourised, obese, well groomed                     Cardiovascular: Regular rate rhythm, no peripheral edema, warm, nontender. Eyes: Conjunctivae clear without exudates or hemorrhage Neck: Supple, no carotid bruise. Pulmonary: Clear to auscultation bilaterally   NEUROLOGICAL EXAM:  MENTAL STATUS: Speech:    Speech is normal; fluent and spontaneous with normal comprehension.  Cognition:    The patient is oriented to person, place, and time;     recent and remote memory intact;     language fluent;     normal attention, concentration,     fund of knowledge.  CRANIAL NERVES: CN II: Visual fields are full to confrontation. Fundoscopic exam is normal with sharp discs and no vascular changes. Venous pulsations are present bilaterally. Pupils are 4 mm and briskly reactive to light. Visual acuity is 20/20 bilaterally. CN III, IV, VI: extraocular movement are normal. No ptosis. CN V: Facial sensation is intact to pinprick in all 3 divisions  bilaterally. Corneal responses are intact.  CN VII: Face is symmetric with normal eye closure and smile. CN VIII: Hearing is normal to rubbing fingers CN IX, X: Palate elevates symmetrically. Phonation is normal. CN XI: Head turning and shoulder shrug are intact CN XII: Tongue is midline with normal movements and no atrophy.  MOTOR: There is no pronator drift of out-stretched arms. Muscle bulk and tone are normal. Muscle strength is normal.   Shoulder abduction Shoulder external rotation Elbow flexion Elbow extension Wrist flexion Wrist extension Finger abduction Hip flexion Knee flexion Knee extension Ankle dorsi flexion Ankle plantar flexion  R 5 5 5 5 5 5 5 5 5 5 5 5   L 5 5 5 5 5 5 5 5 5 5 5 5     REFLEXES: Reflexes are 3 and symmetric at the biceps, triceps, knees, and ankles. Plantar responses are flexor.  SENSORY: Light touch, pinprick, position sense, and vibration sense are intact in fingers and toes.  COORDINATION: Rapid alternating movements and fine finger movements are intact. There is no dysmetria on finger-to-nose and heel-knee-shin. There are no abnormal or extraneous movements.   GAIT/STANCE:  Posture is normal. Gait is steady with normal steps, base, arm swing, and turning. Heel and toe walking are normal. Tandem gait is normal.  Romberg is absent.    DIAGNOSTIC DATA (LABS, IMAGING, TESTING) - I reviewed patient records, labs, notes, testing and imaging myself where available.  Lab Results  Component Value Date   WBC 9.6 05/23/2014   HGB 13.5 05/23/2014   HCT 39.0 05/23/2014   MCV 87 05/23/2014   PLT 258 05/23/2014    ASSESSMENT AND PLAN  44 y.o. year old with relapsing remitting multiple sclerosis, diagnosis was made in November 2013, Tysarbri since December 2013, JC virus antibody was negative, doing very well, no clinical flareup, also taking provigil for fatigue, which has been very helpful 1. continue Tysabri 2. JC virus antibody and MRI brain in Sept  2016,  3. Follow up afterwards  Marcial Pacas, M.D. Ph.D.  [facility] [place], [place] [address] Phone: [phone] Fax:      [phone]

## 2015-01-23 ENCOUNTER — Encounter (HOSPITAL_COMMUNITY): Payer: BLUE CROSS/BLUE SHIELD

## 2015-02-20 ENCOUNTER — Encounter (HOSPITAL_COMMUNITY): Payer: BLUE CROSS/BLUE SHIELD

## 2015-03-07 ENCOUNTER — Other Ambulatory Visit: Payer: Self-pay | Admitting: Neurology

## 2015-03-07 ENCOUNTER — Telehealth: Payer: Self-pay | Admitting: *Deleted

## 2015-03-07 MED ORDER — MODAFINIL 100 MG PO TABS: 100.0000 mg | ORAL_TABLET | Freq: Two times a day (BID) | ORAL | Status: DC

## 2015-03-07 NOTE — Telephone Encounter (Signed)
Request entered, forwarded to provider for approval.  

## 2015-03-07 NOTE — Telephone Encounter (Signed)
Rx for modafinil faxed to [facility] at [phone].

## 2015-03-07 NOTE — Telephone Encounter (Signed)
Pt called and needs a need refill on modafinil (PROVIGIL) 100 MG tablet. Can reach pt at [phone]

## 2015-03-20 ENCOUNTER — Encounter (HOSPITAL_COMMUNITY): Payer: BLUE CROSS/BLUE SHIELD

## 2015-04-18 ENCOUNTER — Telehealth: Payer: Self-pay | Admitting: Neurology

## 2015-04-18 DIAGNOSIS — G35 Multiple sclerosis: Secondary | ICD-10-CM

## 2015-04-18 NOTE — Telephone Encounter (Signed)
MRi brain w/wo order placed.

## 2015-05-15 ENCOUNTER — Ambulatory Visit (INDEPENDENT_AMBULATORY_CARE_PROVIDER_SITE_OTHER): Payer: BLUE CROSS/BLUE SHIELD

## 2015-05-15 DIAGNOSIS — G35 Multiple sclerosis: Secondary | ICD-10-CM | POA: Diagnosis not present

## 2015-05-16 MED ORDER — GADOPENTETATE DIMEGLUMINE 469.01 MG/ML IV SOLN: 20.0000 mL | Freq: Once | INTRAVENOUS | Status: DC | PRN

## 2015-05-17 ENCOUNTER — Telehealth: Payer: Self-pay | Admitting: Neurology

## 2015-05-17 NOTE — Telephone Encounter (Signed)
[name]: Please call patient, repeat MRI of the brain with and without contrast showed mild progression compared to November 2013, please give her a follow-up visit to review films and treatment plan   IMPRESSION:  Abnormal MRI brain (with and without) demonstrating: 1. Numerous round and ovoid periventricular, subcortical and peri-callosal chronic demyelinating plaques. Several of these are hypointense on T1 views. 2. No abnormal lesions are seen on post contrast views.  3. Compared to MRI on 07/13/12, there are several (at least 4 left hemisphere) new chronic plaques noted in the current study, and not clearly seen in the prior study.

## 2015-05-17 NOTE — Telephone Encounter (Signed)
Appt scheduled to come in for MRI results.

## 2015-06-04 ENCOUNTER — Encounter: Payer: Self-pay | Admitting: Neurology

## 2015-06-04 ENCOUNTER — Ambulatory Visit (INDEPENDENT_AMBULATORY_CARE_PROVIDER_SITE_OTHER): Payer: BLUE CROSS/BLUE SHIELD | Admitting: Neurology

## 2015-06-04 VITALS — BP 128/80 | HR 80 | Ht 63.5 in | Wt 209.0 lb

## 2015-06-04 DIAGNOSIS — G35 Multiple sclerosis: Secondary | ICD-10-CM | POA: Diagnosis not present

## 2015-06-04 MED ORDER — SERTRALINE HCL 50 MG PO TABS: 100.0000 mg | ORAL_TABLET | Freq: Every day | ORAL | Status: DC

## 2015-06-04 MED ORDER — BACLOFEN 10 MG PO TABS: 10.0000 mg | ORAL_TABLET | Freq: Three times a day (TID) | ORAL | Status: DC

## 2015-06-04 NOTE — Progress Notes (Signed)
[facility]  PATIENT: Suzanne Robbins DOB: 1971-09-06   REASON FOR VISIT: Followup for multiple sclerosis   HISTORY OF PRESENT ILLNESS: Suzanne Robbins, 44 -year-old female with history of relapsing remitting multiple sclerosis   In August 2013, she noticed numbness of her left foot, intermittent, faded away in 2 weeks  In September 2013, she noticed left arm numbness, itching underneath her skin, lasting for 2 weeks  In October 2013, she began to notice unbalanced gait, dizziness, progressively worse, woke up one morning noticed double vision, worsening balance difficulty, binocular diplopia, spinning sensation, profound gait difficulty, fatigue, nausea   She was admitted to [facility], July 13 2012 .  MRI brain ([place]) showed numerous white matter lesions which are predominately periventricular location suspicious for chronic Suzanne, with suspected acute demyelinating lesion in the right temporal lobe periventricular optic radiation. Mild premature atrophy. No contrast was given. MRA brain was normal.  [facility], Jul 27, 2012 MRI of the brain showed Multiple chronic periventricular and subcortical chronic demyelinating plaques.There are 3 enhancing lesions likely acute demyelinating plaques in the right periatrial, left posterior frontal and left occipital regions  MRI cervical spine showed Multiple chronic demyelinating plaques at C2-3, C3, C4, C5-6, and C7. No contrast enhancement   Laboratory evaluation showed normal ESR 10, CMP, CBC, negative Lyme titer, acetylcholine receptor antibody, ANA, TSH,   CSF, WBC 3, RBC 12, negative cryptococcal antigen, 5 oligoclonal bands, total protein 29, glucose 65  VEP 07/25/2012: was within normal limits bilaterally. No evidence of conduction slowing was seen within the anterior visual pathways  Tysabri since December 2013.  JC virus antibody was negative in Jul 19, 2102. C antibody was intermediate on March 2015. 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   UPDATE Dec 17th 2015: She has word finding difficulty, urinary urgency, fatigue. She deines trouble walkings, overall her symptoms has been stable, tolerating [name],   Most recent repeat MRI September 2015: numerous periventricular and subcortical white matter hyperintensities compatible with chronic multiple sclerosis. No enhancing lesions are noted. The presence of several T1 black holes and mild generalized atrophy indicates chronic disease. Compared with previous MRI scan dated 07/13/2012 there appears to be minimum progression of disease activity  UPDATE Suzanne Robbins 26th 2016: She is now taking Provigil 1108m bid, has helped her fatigue, still works full times, no blurry vision, no gait difficulty, she continues to receive Tysarbri IV infusion every one month, works well for her, no Suzanne flareup   Update June 04 2015: She is overall doing very well, provigil has helped her fatigue, she complains of mild depression despite taking Zoloft 50 mg every night.  I have advised her increase Zoloft to 50 mg 2 tablets every night,  We have reviewed repeat MRI of the brain with without contrast in September 2016 Numerous round and ovoid periventricular, subcortical and peri-callosal chronic demyelinating plaques. Several of these are hypointense on T1 views. No abnormal lesions are seen on post contrast views.  Compared to MRI on 07/13/12, there are several (at least 4 left hemisphere) new chronic plaques noted in the current study, and not clearly seen in the prior study.  REVIEW OF SYSTEMS: Full 14 system review of systems performed and notable only for those listed, all others are neg:  Fatigue  ALLERGIES: No Known Allergies  HOME MEDICATIONS: Outpatient Prescriptions Prior to Visit  Medication Sig Dispense Refill  .  estradiol (ESTRACE) 2 MG tablet Take 2 mg by mouth daily.    Marland Kitchen  ibuprofen (ADVIL,MOTRIN) 200 MG tablet Take 400 mg by mouth every 6 (six) hours as needed. For pain    . modafinil (PROVIGIL) 100 MG tablet Take 1 tablet (100 mg total) by mouth 2 (two) times daily. 60 tablet 5  . natalizumab 300 mg in sodium chloride 0.9 % 100 mL Inject 300 mg into the vein every 28 (twenty-eight) days.    Marland Kitchen oxyCODONE-acetaminophen (PERCOCET/ROXICET) 5-325 MG per tablet Take 1 tablet by mouth every 4 (four) hours as needed. 60 tablet 0  . sertraline (ZOLOFT) 50 MG tablet Take 1 tablet (50 mg total) by mouth daily. 30 tablet 12   Facility-Administered Medications Prior to Visit  Medication Dose Route Frequency Provider Last Rate Last Dose  . gadopentetate dimeglumine (MAGNEVIST) injection 20 mL  20 mL Intravenous Once PRN Marcial Pacas, MD        PAST MEDICAL HISTORY: Past Medical History  Diagnosis Date  . Uterine cancer 2000  . Suzanne (multiple sclerosis) 07/13/12    PAST SURGICAL HISTORY: Past Surgical History  Procedure Laterality Date  . Abdominal hysterectomy  2000    FAMILY HISTORY: Family History  Problem Relation Age of Onset  . Lung cancer Father     SOCIAL HISTORY: Social History   Social History  . Marital Status: Legally Separated    Spouse Name: N/A  . Number of Children: 2  . Years of Education: 14   Occupational History  . banker   .     Social History Main Topics  . Smoking status: Never Smoker   . Smokeless tobacco: Never Used  . Alcohol Use: No  . Drug Use: No  . Sexual Activity: Not on file   Other Topics Concern  . Not on file   Social History Narrative   Patient lives at home with her daughter. Patient has a two years of college education.   Right handed.   Caffeine- two sodas daily.   Patient is separated.      PHYSICAL EXAM  PHYSICAL EXAMNIATION:  Gen: NAD, conversant, well nourised, obese, well groomed                     Cardiovascular: Regular rate rhythm, no peripheral edema, warm, nontender. Eyes: Conjunctivae  clear without exudates or hemorrhage Neck: Supple, no carotid bruise. Pulmonary: Clear to auscultation bilaterally   NEUROLOGICAL EXAM:  MENTAL STATUS: Speech:    Speech is normal; fluent and spontaneous with normal comprehension.  Cognition:    The patient is oriented to person, place, and time;     recent and remote memory intact;     language fluent;     normal attention, concentration,     fund of knowledge.  CRANIAL NERVES: CN II: Visual fields are full to confrontation. Fundoscopic exam is normal with sharp discs and no vascular changes. pupils are 4 mm and briskly reactive to light. Visual acuity is 20/20 bilaterally. CN III, IV, VI: extraocular movement are normal. No ptosis. CN V: Facial sensation is intact to pinprick in all 3 divisions bilaterally. Corneal responses are intact.  CN VII: Face is symmetric with normal eye closure and smile. CN VIII: Hearing is normal to rubbing fingers CN IX, X: Palate elevates symmetrically. Phonation is normal. CN XI: Head turning and shoulder shrug are intact CN XII: Tongue is midline with normal movements and no atrophy.  MOTOR: She has mild bilateral lower  extremity spasticity, no significant muscle weakness notice.   REFLEXES: Reflexes are 3 and symmetric at the biceps, triceps, knees, and ankles. Plantar responses are flexor.  SENSORY: Light touch, pinprick, position sense, and vibration sense are intact in fingers and toes.  COORDINATION: Rapid alternating movements and fine finger movements are intact. There is no dysmetria on finger-to-nose and heel-knee-shin.   GAIT/STANCE: Posture is normal. Gait is steady, mildly wide-based stiff. Romberg is absent.    DIAGNOSTIC DATA (LABS, IMAGING, TESTING) - I reviewed patient records, labs, notes, testing and imaging myself where available.   ASSESSMENT AND PLAN  44 y.o. year  old female  Relapsing remitting multiple sclerosis,   diagnosis was made in November 2013,  Tysarbri since December 2013, no flareups since   JC virus antibody was negative  Repeat laboratory evaluation, CMP, JC virus antibody titer  Chronic fatigue  Provigil 100 mg twice a day Depression  Increase Zoloft to 50 mge 2 tablets very night  Bilateral lower extremity spasticity  Add on baclofen 10 mg 3 times a day   Marcial Pacas, M.D. Ph.D.  [facility] [place],  [address] Phone: [phone] Fax:      [phone]

## 2015-06-05 ENCOUNTER — Telehealth: Payer: Self-pay | Admitting: Neurology

## 2015-06-05 LAB — COMPREHENSIVE METABOLIC PANEL
ALBUMIN: 4.4 g/dL (ref 3.5–5.5)
ALT: 83 IU/L — ABNORMAL HIGH (ref 0–32)
AST: 54 IU/L — ABNORMAL HIGH (ref 0–40)
Albumin/Globulin Ratio: 2.2 (ref 1.1–2.5)
Alkaline Phosphatase: 98 IU/L (ref 39–117)
BUN / CREAT RATIO: 12 (ref 9–23)
BUN: 11 mg/dL (ref 6–24)
Bilirubin Total: 0.4 mg/dL (ref 0.0–1.2)
CALCIUM: 9.2 mg/dL (ref 8.7–10.2)
CO2: 23 mmol/L (ref 18–29)
CREATININE: 0.95 mg/dL (ref 0.57–1.00)
Chloride: 102 mmol/L (ref 97–108)
GFR calc Af Amer: 84 mL/min/{1.73_m2} (ref >59)
GFR calc non Af Amer: 73 mL/min/{1.73_m2} (ref >59)
GLOBULIN, TOTAL: 2 g/dL (ref 1.5–4.5)
Glucose: 93 mg/dL (ref 65–99)
Potassium: 4.4 mmol/L (ref 3.5–5.2)
SODIUM: 140 mmol/L (ref 134–144)
Total Protein: 6.4 g/dL (ref 6.0–8.5)

## 2015-06-05 NOTE — Telephone Encounter (Signed)
Left message for a return call

## 2015-06-05 NOTE — Telephone Encounter (Signed)
Please call patient, laboratory evaluation showed mild abnormal liver functional tests, will continue to follow-up on it, repeat laboratory evaluation next visit, I have also faxed the results to her primary care physician

## 2015-06-05 NOTE — Telephone Encounter (Signed)
Spoke to Abbie - she is aware of results.

## 2015-06-11 ENCOUNTER — Telehealth: Payer: Self-pay

## 2015-06-11 NOTE — Telephone Encounter (Signed)
I spoke to the patient in regards to the [employer] study. The patient stated that she is not interested in participating, as she stopped taking Baclofen due to the headache she experienced whenever she took it. I thanked the patient for her time and collaboration.

## 2015-06-19 ENCOUNTER — Telehealth: Payer: Self-pay | Admitting: *Deleted

## 2015-06-19 NOTE — Telephone Encounter (Signed)
Labs collected 06/04/15: JCV 0.14 negative

## 2015-07-31 ENCOUNTER — Other Ambulatory Visit: Payer: Self-pay

## 2015-07-31 MED ORDER — SERTRALINE HCL 50 MG PO TABS: 100.0000 mg | ORAL_TABLET | Freq: Every day | ORAL | Status: DC

## 2015-10-30 ENCOUNTER — Other Ambulatory Visit: Payer: Self-pay | Admitting: Neurology

## 2015-12-03 ENCOUNTER — Encounter: Payer: Self-pay | Admitting: Neurology

## 2015-12-03 ENCOUNTER — Ambulatory Visit (INDEPENDENT_AMBULATORY_CARE_PROVIDER_SITE_OTHER): Payer: BLUE CROSS/BLUE SHIELD | Admitting: Neurology

## 2015-12-03 VITALS — BP 120/77 | HR 82 | Ht 63.5 in | Wt 211.0 lb

## 2015-12-03 DIAGNOSIS — G35 Multiple sclerosis: Secondary | ICD-10-CM | POA: Diagnosis not present

## 2015-12-03 NOTE — Progress Notes (Addendum)
Chief Complaint  Patient presents with  . Multiple Sclerosis    No new concerns today.  Says she feels good.  She has started Nutisystem and is trying to lose weight.  She is no longer taking baclofen or modafinil.     [facility]  PATIENT: Suzanne Robbins DOB: 18-Nov-1970   REASON FOR VISIT: Followup for multiple sclerosis   HISTORY OF PRESENT ILLNESS: Suzanne Robbins, 45 -year-old female with history of relapsing remitting multiple sclerosis   In August 2013, she noticed numbness of her left foot, intermittent, faded away in 2 weeks  In September 2013, she noticed left arm numbness, itching underneath her skin, lasting for 2 weeks  In October 2013, she began to notice unbalanced gait, dizziness, progressively worse, woke up one morning noticed double vision, worsening balance difficulty, binocular diplopia, spinning sensation, profound gait difficulty, fatigue, nausea   She was admitted to [facility], July 13 2012 .  MRI brain ([place]) showed numerous white matter lesions which are predominately periventricular location suspicious for chronic Suzanne, with suspected acute demyelinating lesion in the right temporal lobe periventricular optic radiation. Mild premature atrophy. No contrast was given. MRA brain was normal.  [facility], Jul 27, 2012 MRI of the brain showed Multiple chronic periventricular and subcortical chronic demyelinating plaques.There are 3 enhancing lesions likely acute demyelinating plaques in the right periatrial, left posterior frontal and left occipital regions  MRI cervical spine showed Multiple chronic demyelinating plaques at C2-3, C3, C4, C5-6, and C7. No contrast enhancement   Laboratory evaluation showed normal ESR 10, CMP, CBC, negative Lyme titer, acetylcholine receptor antibody, ANA, TSH,   CSF, WBC 3, RBC 12, negative cryptococcal antigen, 5 oligoclonal bands, total protein 29, glucose 65  VEP 07/25/2012: was within normal limits  bilaterally. No evidence of conduction slowing was seen within the anterior visual pathways  Tysabri since December 2013.  JC virus antibody was negative in Jul 19, 2102. C antibody was intermediate on March 2015. 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   UPDATE Dec 17th 2015: She has word finding difficulty, urinary urgency, fatigue. She deines trouble walkings, overall her symptoms has been stable, tolerating [name],   Most recent repeat MRI September 2015: numerous periventricular and subcortical white matter hyperintensities compatible with chronic multiple sclerosis. No enhancing lesions are noted. The presence of several T1 black holes and mild generalized atrophy indicates chronic disease. Compared with previous MRI scan dated 07/13/2012 there appears to be minimum progression of disease activity  UPDATE Brielle 26th 2016: She is now taking Provigil 117m bid, has helped her fatigue, still works full times, no blurry vision, no gait difficulty, she continues to receive Tysarbri IV infusion every one month, works well for her, no Suzanne flareup   Update June 04 2015: She is overall doing very well, provigil has helped her fatigue, she complains of mild depression despite taking Zoloft 50 mg every night.  I have advised her increase Zoloft to 50 mg 2 tablets every night,  We have reviewed repeat MRI of the brain with without contrast in September 2016 Numerous round and ovoid periventricular, subcortical and peri-callosal chronic demyelinating plaques. Several of these are hypointense on T1 views. No abnormal lesions are seen on post contrast views.  Compared to MRI on 07/13/12, there are several (at least 4 left hemisphere) new chronic plaques noted in the current study, and not clearly seen  in the prior study.  UPDATE March 28th 2017: She is tolerating Tysarbri infusion well, no longer taking provigil for  fatigue, no needs for baclofen for bilateral lower extremity spasticity, denied bowel and bladder incontinence, no urinary urgency, no significant gait difficulty, she is only taking Zoloft 50 mg daily  REVIEW OF SYSTEMS: Full 14 system review of systems performed and notable only for those listed, all others are neg:  Mild depression  ALLERGIES: No Known Allergies  HOME MEDICATIONS: Outpatient Prescriptions Prior to Visit  Medication Sig Dispense Refill  . estradiol (ESTRACE) 2 MG tablet Take 2 mg by mouth daily.    Marland Kitchen ibuprofen (ADVIL,MOTRIN) 200 MG tablet Take 400 mg by mouth every 6 (six) hours as needed. For pain    . natalizumab 300 mg in sodium chloride 0.9 % 100 mL Inject 300 mg into the vein every 28 (twenty-eight) days.    Marland Kitchen sertraline (ZOLOFT) 50 MG tablet Take 2 tablets (100 mg total) by mouth daily. 180 tablet 3  . baclofen (LIORESAL) 10 MG tablet Take 1 tablet (10 mg total) by mouth 3 (three) times daily. 90 each 6  . modafinil (PROVIGIL) 100 MG tablet Take 1 tablet (100 mg total) by mouth 2 (two) times daily. 60 tablet 5   Facility-Administered Medications Prior to Visit  Medication Dose Route Frequency Provider Last Rate Last Dose  . gadopentetate dimeglumine (MAGNEVIST) injection 20 mL  20 mL Intravenous Once PRN Marcial Pacas, MD        PAST MEDICAL HISTORY: Past Medical History  Diagnosis Date  . Uterine cancer ([place]) 2000  . Suzanne (multiple sclerosis) ([place]) 07/13/12    PAST SURGICAL HISTORY: Past Surgical History  Procedure Laterality Date  . Abdominal hysterectomy  2000    FAMILY HISTORY: Family History  Problem Relation Age of Onset  . Lung cancer Father     SOCIAL HISTORY: Social History   Social History  . Marital Status: Legally Separated    Spouse Name: N/A  . Number of Children: 2  . Years of Education: 14   Occupational History  . banker   .     Social History Main Topics  . Smoking status: Never Smoker   . Smokeless tobacco: Never Used  .  Alcohol Use: No  . Drug Use: No  . Sexual Activity: Not on file   Other Topics Concern  . Not on file   Social History Narrative   Patient lives at home with her daughter. Patient has a two years of college education.   Right handed.   Caffeine- two sodas daily.   Patient is separated.      PHYSICAL EXAM  PHYSICAL EXAMNIATION:  Gen: NAD, conversant, well nourised, obese, well groomed                     Cardiovascular: Regular rate rhythm, no peripheral edema, warm, nontender. Eyes: Conjunctivae clear without exudates or hemorrhage Neck: Supple, no carotid bruise. Pulmonary: Clear to auscultation bilaterally   NEUROLOGICAL EXAM:  MENTAL STATUS: Speech:    Speech is normal; fluent and spontaneous with normal comprehension.  Cognition:    The patient is oriented to person, place, and time;     recent and remote memory intact;     language fluent;     normal attention, concentration,     fund of knowledge.  CRANIAL NERVES: CN II: Visual fields are full to confrontation. Fundoscopic exam is normal with sharp discs and no vascular changes. pupils  are 4 mm and briskly reactive to light. Visual acuity is 20/20 bilaterally. CN III, IV, VI: extraocular movement are normal. No ptosis. CN V: Facial sensation is intact to pinprick in all 3 divisions bilaterally. Corneal responses are intact.  CN VII: Face is symmetric with normal eye closure and smile. CN VIII: Hearing is normal to rubbing fingers CN IX, X: Palate elevates symmetrically. Phonation is normal. CN XI: Head turning and shoulder shrug are intact CN XII: Tongue is midline with normal movements and no atrophy.  MOTOR: She has mild bilateral lower extremity spasticity, no significant muscle weakness notice.  REFLEXES: Reflexes are 3 and symmetric at the biceps, triceps, knees, and ankles. Plantar responses are flexor.  SENSORY: Light touch, pinprick, position sense, and vibration sense are intact in fingers and  toes.  COORDINATION: Rapid alternating movements and fine finger movements are intact. There is no dysmetria on finger-to-nose and heel-knee-shin.   GAIT/STANCE: Posture is normal. Gait is steady, is able to perform tiptoe, heel walking, Romberg is absent.  DIAGNOSTIC DATA (LABS, IMAGING, TESTING)  Tysabri since December 2013.  JC virus antibody was negative in Jul 19, 2102.  intermediate on March 2015. Negative Sep 2015, negative August 23 2014, negative 0.14 in Sep 2016.  MRI brain w/wo in Sep 2016:   Numerous round and ovoid periventricular, subcortical and peri-callosal chronic demyelinating plaques. Several of these are hypointense on T1 views.  No abnormal lesions are seen on post contrast views.   Compared to MRI on 07/13/12, there are several (at least 4 left hemisphere) new chronic plaques noted in the current study, and not clearly seen in the prior study.  MRI cervical w/wo Oct 2014: Left paracentral disc protrusion at C5-6 but without significant compression. Ill-defined spinal cord hyperintensities at C3-4 as well as C5 and C7 may represent remote age demyelinating plaques.  No enhancing lesions are noted.  ASSESSMENT AND PLAN  45 y.o. year  old female  Relapsing remitting multiple sclerosis,   Continue with Tysarbri infusion   I have suggested repeat MRI of the brain with and without contrast, she wants to hold off until September 2017  Repeat laboratory evaluation, CMP, JC virus antibody titer   Chronic fatigue, bilateral lower extremity spasticity  Has much improved  Depression  Continue Zoloft 50 mge every day    Marcial Pacas, M.D. Ph.D.  [facility] [place],  [address] Phone: [phone] Fax:      [phone]

## 2015-12-04 ENCOUNTER — Telehealth: Payer: Self-pay | Admitting: Neurology

## 2015-12-04 LAB — COMPREHENSIVE METABOLIC PANEL
A/G RATIO: 2.2 (ref 1.2–2.2)
ALBUMIN: 4.6 g/dL (ref 3.5–5.5)
ALT: 115 IU/L — ABNORMAL HIGH (ref 0–32)
AST: 88 IU/L — ABNORMAL HIGH (ref 0–40)
Alkaline Phosphatase: 95 IU/L (ref 39–117)
BILIRUBIN TOTAL: 0.5 mg/dL (ref 0.0–1.2)
BUN / CREAT RATIO: 12 (ref 9–23)
BUN: 11 mg/dL (ref 6–24)
CHLORIDE: 101 mmol/L (ref 96–106)
CO2: 22 mmol/L (ref 18–29)
Calcium: 9.6 mg/dL (ref 8.7–10.2)
Creatinine, Ser: 0.89 mg/dL (ref 0.57–1.00)
GFR calc non Af Amer: 79 mL/min/{1.73_m2} (ref >59)
GFR, EST AFRICAN AMERICAN: 91 mL/min/{1.73_m2} (ref >59)
GLOBULIN, TOTAL: 2.1 g/dL (ref 1.5–4.5)
Glucose: 93 mg/dL (ref 65–99)
Potassium: 5.3 mmol/L — ABNORMAL HIGH (ref 3.5–5.2)
SODIUM: 140 mmol/L (ref 134–144)
TOTAL PROTEIN: 6.7 g/dL (ref 6.0–8.5)

## 2015-12-04 LAB — CBC WITH DIFFERENTIAL
Basophils Absolute: 0 10*3/uL (ref 0.0–0.2)
Basos: 0 %
EOS (ABSOLUTE): 0.1 10*3/uL (ref 0.0–0.4)
EOS: 1 %
Hematocrit: 39.3 % (ref 34.0–46.6)
Hemoglobin: 13 g/dL (ref 11.1–15.9)
IMMATURE GRANULOCYTES: 1 %
Immature Grans (Abs): 0.1 10*3/uL (ref 0.0–0.1)
LYMPHS ABS: 3 10*3/uL (ref 0.7–3.1)
Lymphs: 29 %
MCH: 29.7 pg (ref 26.6–33.0)
MCHC: 33.1 g/dL (ref 31.5–35.7)
MCV: 90 fL (ref 79–97)
MONOS ABS: 1 10*3/uL — AB (ref 0.1–0.9)
Monocytes: 10 %
Neutrophils Absolute: 6.2 10*3/uL (ref 1.4–7.0)
Neutrophils: 59 %
RBC: 4.37 x10E6/uL (ref 3.77–5.28)
RDW: 14.3 % (ref 12.3–15.4)
WBC: 10.4 10*3/uL (ref 3.4–10.8)

## 2015-12-04 NOTE — Telephone Encounter (Signed)
Please call patient, laboratory evaluation showed slight abnormal liver functional test, worsened compared to September 2016,  If she does not have a primary care appointment pending soon,  she may come back for repeat test in 3 months, this will also include repeat liver functional test, hepatitis panel

## 2015-12-04 NOTE — Telephone Encounter (Signed)
Spoke to Suzanne Robbins - she is going to get established with a new PCP and is planning to go in for a physical.  If she is not able to complete this within three months, she will call us back to have repeat labs.

## 2015-12-04 NOTE — Telephone Encounter (Signed)
Debra/[facility] [phone] called to request prescription for Tysabri

## 2015-12-04 NOTE — Telephone Encounter (Signed)
Error

## 2015-12-04 NOTE — Telephone Encounter (Signed)
Provided this information to [facility] who handles her infusion and medication.

## 2015-12-09 ENCOUNTER — Telehealth: Payer: Self-pay | Admitting: *Deleted

## 2015-12-09 NOTE — Telephone Encounter (Signed)
Labs collected 12/04/15 - JCV negative (0.19)

## 2016-03-04 ENCOUNTER — Telehealth: Payer: Self-pay | Admitting: Neurology

## 2016-03-04 NOTE — Telephone Encounter (Signed)
Information provided to [facility] in [place].

## 2016-03-04 NOTE — Telephone Encounter (Signed)
Mogie with [employer] is calling stating the patient's authorization for Tysabri will end next Monday. A letter was sent to our office stating this but she states no response yet.  Please contact Mogie at [phone] Ext [phone].

## 2016-03-23 ENCOUNTER — Telehealth: Payer: Self-pay | Admitting: Neurology

## 2016-03-23 DIAGNOSIS — G35 Multiple sclerosis: Secondary | ICD-10-CM

## 2016-03-23 NOTE — Telephone Encounter (Signed)
Patient is calling and states she needs an MRI scheduled before she comes in to see Dr. Krista Blue on 06/04/16.  Please call.

## 2016-03-25 NOTE — Telephone Encounter (Signed)
There is not an order in for an MRI for this patient. Does she need to have one done?

## 2016-03-25 NOTE — Telephone Encounter (Signed)
I have ordered MRI of the brain with and without contrast, this to be done in early September, before her next follow-up visit on June 04 2016.

## 2016-04-07 ENCOUNTER — Other Ambulatory Visit: Payer: Self-pay | Admitting: *Deleted

## 2016-04-07 DIAGNOSIS — G35 Multiple sclerosis: Secondary | ICD-10-CM

## 2016-04-15 ENCOUNTER — Ambulatory Visit (INDEPENDENT_AMBULATORY_CARE_PROVIDER_SITE_OTHER): Payer: BLUE CROSS/BLUE SHIELD

## 2016-04-15 DIAGNOSIS — G35 Multiple sclerosis: Secondary | ICD-10-CM

## 2016-04-16 MED ORDER — GADOPENTETATE DIMEGLUMINE 469.01 MG/ML IV SOLN: 20.0000 mL | Freq: Once | INTRAVENOUS | Status: DC | PRN

## 2016-06-04 ENCOUNTER — Ambulatory Visit (INDEPENDENT_AMBULATORY_CARE_PROVIDER_SITE_OTHER): Payer: BLUE CROSS/BLUE SHIELD | Admitting: Neurology

## 2016-06-04 ENCOUNTER — Telehealth: Payer: Self-pay | Admitting: Neurology

## 2016-06-04 ENCOUNTER — Encounter: Payer: Self-pay | Admitting: Neurology

## 2016-06-04 VITALS — BP 122/79 | HR 84 | Ht 63.5 in | Wt 204.0 lb

## 2016-06-04 DIAGNOSIS — G35 Multiple sclerosis: Secondary | ICD-10-CM | POA: Diagnosis not present

## 2016-06-04 DIAGNOSIS — R252 Cramp and spasm: Secondary | ICD-10-CM

## 2016-06-04 MED ORDER — SERTRALINE HCL 50 MG PO TABS: 100.0000 mg | ORAL_TABLET | Freq: Every day | ORAL | 4 refills | Status: DC

## 2016-06-04 NOTE — Patient Instructions (Signed)
MS society  Gilenya (fingolimod) Tecfidera (dimethyl fumarate)  Iv infusion every 6 months: Ocrevus (ocrelizumab)

## 2016-06-04 NOTE — Telephone Encounter (Signed)
[facility] Patient is ready to be scheduled for Doppler No Pa needed. Thanks [employer] .

## 2016-06-04 NOTE — Telephone Encounter (Signed)
There is an abdominal US but no doppler for this pt.  Called her and she stated she is just waiting to schedule an [place] for her liver.

## 2016-06-04 NOTE — Telephone Encounter (Signed)
Noted Thanks Larene Beach. I will send to [place].

## 2016-06-04 NOTE — Progress Notes (Signed)
Chief Complaint  Patient presents with  . Multiple Sclerosis    She would like to review her MRI and discuss treatment options.     [facility]  PATIENT: Suzanne Robbins DOB: 1971-06-12   REASON FOR VISIT: Followup for multiple sclerosis   HISTORY OF PRESENT ILLNESS: Suzanne Robbins, 45 -year-old female with history of relapsing remitting multiple sclerosis   In August 2013, she noticed numbness of her left foot, intermittent, faded away in 2 weeks  In September 2013, she noticed left arm numbness, itching underneath her skin, lasting for 2 weeks  In October 2013, she began to notice unbalanced gait, dizziness, progressively worse, woke up one morning noticed double vision, worsening balance difficulty, binocular diplopia, spinning sensation, profound gait difficulty, fatigue, nausea   She was admitted to [facility], July 13 2012 .  MRI brain ([place]) showed numerous white matter lesions which are predominately periventricular location suspicious for chronic Suzanne, with suspected acute demyelinating lesion in the right temporal lobe periventricular optic radiation. Mild premature atrophy. No contrast was given. MRA brain was normal.  [facility], Jul 27, 2012 MRI of the brain showed Multiple chronic periventricular and subcortical chronic demyelinating plaques.There are 3 enhancing lesions likely acute demyelinating plaques in the right periatrial, left posterior frontal and left occipital regions  MRI cervical spine showed Multiple chronic demyelinating plaques at C2-3, C3, C4, C5-6, and C7. No contrast enhancement   Laboratory evaluation showed normal ESR 10, CMP, CBC, negative Lyme titer, acetylcholine receptor antibody, ANA, TSH,   CSF, WBC 3, RBC 12, negative cryptococcal antigen, 5 oligoclonal bands, total protein 29, glucose 65  VEP 07/25/2012: was within normal limits bilaterally. No evidence of conduction slowing was seen within the anterior visual  pathways  Tysabri since December 2013.  JC virus antibody was negative in Jul 19, 2102. antibody was intermediate on March 2015. 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 No enhancing lesions are noted. The presence of several T1 black holes and mild generalized atrophy indicates chronic disease. Compared with previous MRI scan dated 07/13/2012 there appears to be minimum progression of disease activity  She was treated with provigil 100 mg twice a day in 2016, which has helped her fatigue,  repeat MRI of the brain with without contrast in September 2016 Numerous round and ovoid periventricular, subcortical and peri-callosal chronic demyelinating plaques. Several of these are hypointense on T1 views. No abnormal lesions are seen on post contrast views.  Compared to MRI on 07/13/12, there are several (at least 4 left hemisphere) new chronic plaques noted in the current study, and not clearly seen in the prior study.--No clinical flareup reported  UPDATE Sept 28th 2017: She now has a new primary care at [facility] Dr. Octavio Robbins, we reviewed previous laboratory evaluation in March 2017, continue sliding trending up of abnormal AST 88, ALT 115,  JC virus was negative with titer of 0.19 on December 04 2015,  We have personally reviewed repeat MRI of the brain with and without contrast in September 2017, no change in comparison with 2016, She is concerned about high co-pay with Tysarbri IV infusion, wants to consider other treatment options, including by mouth forms  REVIEW OF SYSTEMS: Full 14 system review of systems performed and notable only for those  listed, all others are neg:   fatigue  ALLERGIES: No Known Allergies  HOME MEDICATIONS: Outpatient Medications  Prior to Visit  Medication Sig Dispense Refill  . estradiol (ESTRACE) 2 MG tablet Take 2 mg by mouth daily.    Marland Kitchen ibuprofen (ADVIL,MOTRIN) 200 MG tablet Take 400 mg by mouth every 6 (six) hours as needed. For pain    . natalizumab 300 mg in sodium chloride 0.9 % 100 mL Inject 300 mg into the vein every 28 (twenty-eight) days.    Marland Kitchen sertraline (ZOLOFT) 50 MG tablet Take 2 tablets (100 mg total) by mouth daily. 180 tablet 3   Facility-Administered Medications Prior to Visit  Medication Dose Route Frequency Provider Last Rate Last Dose  . gadopentetate dimeglumine (MAGNEVIST) injection 20 mL  20 mL Intravenous Once PRN Marcial Pacas, MD      . gadopentetate dimeglumine (MAGNEVIST) injection 20 mL  20 mL Intravenous Once PRN Marcial Pacas, MD        PAST MEDICAL HISTORY: Past Medical History:  Diagnosis Date  . Suzanne (multiple sclerosis) ([place]) 07/13/12  . Uterine cancer ([place]) 2000    PAST SURGICAL HISTORY: Past Surgical History:  Procedure Laterality Date  . ABDOMINAL HYSTERECTOMY  2000    FAMILY HISTORY: Family History  Problem Relation Age of Onset  . Lung cancer Father     SOCIAL HISTORY: Social History   Social History  . Marital status: Legally Separated    Spouse name: N/A  . Number of children: 2  . Years of education: 14   Occupational History  . banker   .  [employer]   Social History Main Topics  . Smoking status: Never Smoker  . Smokeless tobacco: Never Used  . Alcohol use No  . Drug use: No  . Sexual activity: Not on file   Other Topics Concern  . Not on file   Social History Narrative   Patient lives at home with her daughter. Patient has a two years of college education.   Right handed.   Caffeine- two sodas daily.   Patient is separated.      PHYSICAL EXAM  PHYSICAL EXAMNIATION:  Gen: NAD, conversant, well nourised, obese, well groomed                     Cardiovascular: Regular rate rhythm, no peripheral edema, warm, nontender. Eyes: Conjunctivae clear  without exudates or hemorrhage Neck: Supple, no carotid bruise. Pulmonary: Clear to auscultation bilaterally   NEUROLOGICAL EXAM:  MENTAL STATUS: Speech:    Speech is normal; fluent and spontaneous with normal comprehension.  Cognition:    The patient is oriented to person, place, and time;     recent and remote memory intact;     language fluent;     normal attention, concentration,     fund of knowledge.  CRANIAL NERVES: CN II: Visual fields are full to confrontation. Fundoscopic exam is normal with sharp discs and no vascular changes. pupils are 4 mm and briskly reactive to light. Visual acuity is 20/20 bilaterally. CN III, IV, VI: extraocular movement are normal. No ptosis. CN V: Facial sensation is intact to pinprick in all 3 divisions bilaterally. Corneal responses are intact.  CN VII: Face is symmetric with normal eye closure and smile. CN VIII: Hearing is normal to rubbing fingers CN IX, X: Palate elevates symmetrically. Phonation is normal. CN XI: Head turning and shoulder shrug are intact CN XII: Tongue is midline with normal movements and no atrophy.  MOTOR: She has mild bilateral lower extremity spasticity, no significant muscle weakness notice.  REFLEXES: Reflexes are 3 and symmetric at the biceps, triceps, knees, and ankles. Plantar responses are flexor.  SENSORY: Light touch, pinprick, position sense, and vibration sense are intact in fingers and toes.  COORDINATION: Rapid alternating movements and fine finger movements are intact. There is no dysmetria on finger-to-nose and heel-knee-shin.   GAIT/STANCE: Posture is normal. Gait is steady, is able to perform tiptoe, heel walking, Romberg is absent.  DIAGNOSTIC DATA (LABS, IMAGING, TESTING)  Tysabri since December 2013.  JC virus antibody was negative in Jul 19, 2102. antibody was intermediate on March 2015. Negative Sep 2015, negative August 23 2014, negative with titer of 0.19 on December 04 2015,  MRI  brain w/wo in Sep 2016:   Numerous round and ovoid periventricular, subcortical and peri-callosal chronic demyelinating plaques. Several of these are hypointense on T1 views.  No abnormal lesions are seen on post contrast views.   Compared to MRI on 07/13/12, there are several (at least 4 left hemisphere) new chronic plaques noted in the current study, and not clearly seen in the prior study.  MRI brain w/wo August 2017: Multiple round and ovoid periventricular and subcortical chronic demyelinating plaques. Some of these are hypointense on T1 views.  No acute plaques. 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 Over all no change compared to MRI from 05/15/15.   MRI cervical w/wo Oct 2014: Left paracentral disc protrusion at C5-6 but without significant compression. Ill-defined spinal cord hyperintensities at C3-4 as well as C5 and C7 may represent remote age demyelinating plaques.  No enhancing lesions are noted.  ASSESSMENT AND PLAN  45 y.o. year  old female  Relapsing remitting multiple sclerosis, high risk medication management  Continue with Tysarbri infusion for now   Repeat laboratory evaluation, CMP, JC virus antibody titer  If her LFTs is normal. She is interested in research trial   Chronic fatigue, bilateral lower extremity spasticity  Has much improved  Depression  Continue Zoloft 50 mge every day    Marcial Pacas, M.D. Ph.D.  [facility] [place], [place] [address] Phone: [phone] Fax:      [phone]  CC: Primary Care physician Dr. Octavio Graves, DO

## 2016-06-05 LAB — COMPREHENSIVE METABOLIC PANEL
ALK PHOS: 85 IU/L (ref 39–117)
ALT: 16 IU/L (ref 0–32)
AST: 22 IU/L (ref 0–40)
Albumin/Globulin Ratio: 2 (ref 1.2–2.2)
Albumin: 4.5 g/dL (ref 3.5–5.5)
BILIRUBIN TOTAL: 0.4 mg/dL (ref 0.0–1.2)
BUN/Creatinine Ratio: 11 (ref 9–23)
BUN: 11 mg/dL (ref 6–24)
CHLORIDE: 102 mmol/L (ref 96–106)
CO2: 20 mmol/L (ref 18–29)
CREATININE: 0.98 mg/dL (ref 0.57–1.00)
Calcium: 9.3 mg/dL (ref 8.7–10.2)
GFR calc Af Amer: 80 mL/min/{1.73_m2} (ref >59)
GFR calc non Af Amer: 69 mL/min/{1.73_m2} (ref >59)
GLUCOSE: 88 mg/dL (ref 65–99)
Globulin, Total: 2.3 g/dL (ref 1.5–4.5)
Potassium: 5.3 mmol/L — ABNORMAL HIGH (ref 3.5–5.2)
Sodium: 139 mmol/L (ref 134–144)
Total Protein: 6.8 g/dL (ref 6.0–8.5)

## 2016-06-05 LAB — CBC
Hematocrit: 39.8 % (ref 34.0–46.6)
Hemoglobin: 13.1 g/dL (ref 11.1–15.9)
MCH: 29.3 pg (ref 26.6–33.0)
MCHC: 32.9 g/dL (ref 31.5–35.7)
MCV: 89 fL (ref 79–97)
PLATELETS: 271 10*3/uL (ref 150–379)
RBC: 4.47 x10E6/uL (ref 3.77–5.28)
RDW: 15.2 % (ref 12.3–15.4)
WBC: 10.7 10*3/uL (ref 3.4–10.8)

## 2016-06-05 LAB — HEPATITIS PANEL, ACUTE
HEP A IGM: NEGATIVE
HEP B C IGM: NEGATIVE
Hepatitis B Surface Ag: NEGATIVE

## 2016-06-08 ENCOUNTER — Encounter: Payer: Self-pay | Admitting: Neurology

## 2016-06-08 DIAGNOSIS — R252 Cramp and spasm: Secondary | ICD-10-CM | POA: Insufficient documentation

## 2016-06-08 DIAGNOSIS — F32A Depression, unspecified: Secondary | ICD-10-CM | POA: Insufficient documentation

## 2016-06-08 DIAGNOSIS — F329 Major depressive disorder, single episode, unspecified: Secondary | ICD-10-CM | POA: Insufficient documentation

## 2016-06-11 ENCOUNTER — Telehealth: Payer: Self-pay | Admitting: Neurology

## 2016-06-11 NOTE — Telephone Encounter (Signed)
Pt called inquiring since her lab for liver were good does she still need to havethe ultra sound?

## 2016-06-11 NOTE — Telephone Encounter (Signed)
She does not need US abdomen, but I could not cancel order anymore, it has been scheduled, she needs to call to cancel.

## 2016-06-11 NOTE — Telephone Encounter (Signed)
Spoke to Avina - she is aware and she will cancel her appt.

## 2016-06-11 NOTE — Telephone Encounter (Signed)
I reviewed her extensive gynecology report, most recent cervix speicmen pathology report in March 2007, low-grade squamous intraepithelial lesion,.   Most recent one was on March 25 2016, negative for intraepithelial lesion or malignancy,  Patient reported a history of hysterectomy in 2000, will try to get history for detail,

## 2016-06-11 NOTE — Telephone Encounter (Signed)
Will cancel [place] order.

## 2016-06-19 ENCOUNTER — Other Ambulatory Visit: Payer: BLUE CROSS/BLUE SHIELD

## 2016-07-22 ENCOUNTER — Encounter: Payer: Self-pay | Admitting: Neurology

## 2016-07-22 ENCOUNTER — Encounter (INDEPENDENT_AMBULATORY_CARE_PROVIDER_SITE_OTHER): Payer: Self-pay | Admitting: Neurology

## 2016-07-22 DIAGNOSIS — G35 Multiple sclerosis: Secondary | ICD-10-CM

## 2016-07-22 DIAGNOSIS — Z0289 Encounter for other administrative examinations: Secondary | ICD-10-CM

## 2016-07-22 NOTE — Progress Notes (Signed)
See separate research documentation 

## 2016-08-03 ENCOUNTER — Other Ambulatory Visit: Payer: Self-pay | Admitting: Neurology

## 2016-08-03 DIAGNOSIS — G35 Multiple sclerosis: Secondary | ICD-10-CM

## 2016-08-13 ENCOUNTER — Ambulatory Visit: Payer: BLUE CROSS/BLUE SHIELD | Admitting: Neurology

## 2016-08-13 ENCOUNTER — Ambulatory Visit
Admission: RE | Admit: 2016-08-13 | Discharge: 2016-08-13 | Disposition: A | Discharge status: Home / Self Care | Payer: No Typology Code available for payment source | Source: Ambulatory Visit | Attending: Neurology | Admitting: Neurology

## 2016-08-13 DIAGNOSIS — G35 Multiple sclerosis: Secondary | ICD-10-CM

## 2016-08-14 ENCOUNTER — Ambulatory Visit
Admission: RE | Admit: 2016-08-14 | Discharge: 2016-08-14 | Disposition: A | Discharge status: Home / Self Care | Payer: No Typology Code available for payment source | Source: Ambulatory Visit | Attending: Neurology | Admitting: Neurology

## 2016-08-18 ENCOUNTER — Telehealth: Payer: Self-pay | Admitting: Neurology

## 2016-08-18 NOTE — Telephone Encounter (Addendum)
Pt is infused through [facility] and they do not use specialty pharmacy.  [facility] has previously been notified of this information.

## 2016-08-18 NOTE — Telephone Encounter (Signed)
Helene Kelp with [facility] is calling to schedule a delivery for shipment for Tysabri for the patient

## 2016-08-19 ENCOUNTER — Encounter: Payer: Self-pay | Admitting: Neurology

## 2016-08-21 ENCOUNTER — Ambulatory Visit: Payer: Self-pay | Admitting: Neurology

## 2016-08-24 ENCOUNTER — Ambulatory Visit: Payer: Self-pay | Admitting: Neurology

## 2016-09-03 ENCOUNTER — Telehealth: Payer: Self-pay | Admitting: *Deleted

## 2016-09-03 NOTE — Telephone Encounter (Signed)
Labs collected on 08/24/16:  JCV - 0.18 negative

## 2016-09-22 ENCOUNTER — Other Ambulatory Visit: Payer: Self-pay | Admitting: Neurology

## 2016-09-22 DIAGNOSIS — G35 Multiple sclerosis: Secondary | ICD-10-CM

## 2016-09-24 ENCOUNTER — Ambulatory Visit: Payer: Self-pay | Admitting: Neurology

## 2016-09-30 ENCOUNTER — Ambulatory Visit
Admission: RE | Admit: 2016-09-30 | Discharge: 2016-09-30 | Disposition: A | Discharge status: Home / Self Care | Payer: Self-pay | Source: Ambulatory Visit | Attending: Neurology | Admitting: Neurology

## 2016-09-30 ENCOUNTER — Ambulatory Visit: Payer: Self-pay | Admitting: Neurology

## 2016-09-30 DIAGNOSIS — G35 Multiple sclerosis: Secondary | ICD-10-CM

## 2016-10-01 ENCOUNTER — Other Ambulatory Visit: Payer: Self-pay | Admitting: *Deleted

## 2016-10-01 ENCOUNTER — Other Ambulatory Visit: Payer: Self-pay | Admitting: Neurology

## 2016-10-01 MED ORDER — SERTRALINE HCL 50 MG PO TABS: 100.0000 mg | ORAL_TABLET | Freq: Every day | ORAL | 3 refills | Status: DC

## 2016-10-05 ENCOUNTER — Telehealth: Payer: Self-pay | Admitting: Neurology

## 2016-10-05 NOTE — Telephone Encounter (Signed)
I spoke to Suzanne Robbins about the MRI of the brain showing 6 enhancing lesions.    She is noting some coordination issues. She would like to continue in the study. I will discuss further with our research coordinator and we will get back to her

## 2016-10-19 ENCOUNTER — Ambulatory Visit: Payer: Self-pay | Admitting: Neurology

## 2016-10-28 ENCOUNTER — Ambulatory Visit: Payer: Self-pay | Admitting: Neurology

## 2016-10-28 ENCOUNTER — Encounter (INDEPENDENT_AMBULATORY_CARE_PROVIDER_SITE_OTHER): Payer: 59 | Admitting: Neurology

## 2016-10-28 DIAGNOSIS — G35 Multiple sclerosis: Secondary | ICD-10-CM

## 2016-10-28 DIAGNOSIS — G35D Multiple sclerosis, unspecified: Secondary | ICD-10-CM

## 2016-10-28 DIAGNOSIS — R269 Unspecified abnormalities of gait and mobility: Secondary | ICD-10-CM

## 2016-10-28 DIAGNOSIS — Z0289 Encounter for other administrative examinations: Secondary | ICD-10-CM

## 2016-10-28 MED ORDER — SERTRALINE HCL 50 MG PO TABS: 150.0000 mg | ORAL_TABLET | Freq: Every day | ORAL | 11 refills | Status: DC

## 2016-10-28 MED ORDER — SUMATRIPTAN SUCCINATE 50 MG PO TABS: 50.0000 mg | ORAL_TABLET | ORAL | 0 refills | Status: DC | PRN

## 2016-10-28 NOTE — Progress Notes (Signed)
No chief complaint on file.    [facility]  PATIENT: Suzanne Robbins DOB: [birth year]   REASON FOR VISIT: Followup for multiple sclerosis   HISTORY OF PRESENT ILLNESS: Suzanne Robbins, 46 -year-old female with history of relapsing remitting multiple sclerosis   In August 2013, she noticed numbness of her left foot, intermittent, faded away in 2 weeks  In September 2013, she noticed left arm numbness, itching underneath her skin, lasting for 2 weeks  In October 2013, she began to notice unbalanced gait, dizziness, progressively worse, woke up one morning noticed double vision, worsening balance difficulty, binocular diplopia, spinning sensation, profound gait difficulty, fatigue, nausea   She was admitted to [facility], July 13 2012 .  MRI brain ([place]) showed numerous white matter lesions which are predominately periventricular location suspicious for chronic Suzanne, with suspected acute demyelinating lesion in the right temporal lobe periventricular optic radiation. Mild premature atrophy. No contrast was given. MRA brain was normal.  [facility], Jul 27, 2012 MRI of the brain showed Multiple chronic periventricular and subcortical chronic demyelinating plaques.There are 3 enhancing lesions likely acute demyelinating plaques in the right periatrial, left posterior frontal and left occipital regions  MRI cervical spine showed Multiple chronic demyelinating plaques at C2-3, C3, C4, C5-6, and C7. No contrast enhancement   Laboratory evaluation showed normal ESR 10, CMP, CBC, negative Lyme titer, acetylcholine receptor antibody, ANA, TSH,   CSF, WBC 3, RBC 12, negative cryptococcal antigen, 5 oligoclonal bands, total protein 29, glucose 65  VEP 07/25/2012: was within normal limits bilaterally. No evidence of conduction slowing was seen within the anterior visual pathways  Tysabri since December 2013.  JC virus antibody was negative in Jul 19, 2102. antibody was intermediate  on March 2015. 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 No enhancing lesions are noted. The presence of several T1 black holes and mild generalized atrophy indicates chronic disease. Compared with previous MRI scan dated 07/13/2012 there appears to be minimum progression of disease activity  She was treated with provigil 100 mg twice a day in 2016, which has helped her fatigue,  repeat MRI of the brain with without contrast in September 2016 Numerous round and ovoid periventricular, subcortical and peri-callosal chronic demyelinating plaques. Several of these are hypointense on T1 views. No abnormal lesions are seen on post contrast views.  Compared to MRI on 07/13/12, there are several (at least 4 left hemisphere) new chronic plaques noted in the current study, and not clearly seen in the prior study.--No clinical flareup reported  UPDATE Sept 28th 2017: She now has a new primary care at [facility] Dr. Octavio Graves, we reviewed previous laboratory evaluation in March 2017, continue sliding trending up of abnormal AST 88, ALT 115,  JC virus was negative with titer of 0.19 on December 04 2015,  We have personally reviewed repeat MRI of the brain with and without contrast in September 2017, no change in comparison with 2016, She is concerned about high co-pay with [name] infusion, wants to consider other treatment options, including by mouth forms  UPDATE Oct 28 2016: Last [name] infusion was in October 2017, she noticed unsteady gait on September 23 2016, on repeat MRI on September 30 2016, in comparison with previous MRI of the brain on  August 15 2016, there are multiple T2, flair hyperintensity foci, 6 enhancement, that was new  She was treated with IV steroid for 4  days, with significant improvement,  She complains of worsening depression, frequent migraine headaches, couple times a week, limited response to ibuprofen 220 mg 2 tablets daily  REVIEW OF SYSTEMS: Full 14 system review of systems performed and notable only for those listed, all others are neg:   fatigue  ALLERGIES: No Known Allergies  HOME MEDICATIONS: Outpatient Medications Prior to Visit  Medication Sig Dispense Refill  . estradiol (ESTRACE) 2 MG tablet Take 2 mg by mouth daily.    Marland Kitchen ibuprofen (ADVIL,MOTRIN) 200 MG tablet Take 400 mg by mouth every 6 (six) hours as needed. For pain    . natalizumab 300 mg in sodium chloride 0.9 % 100 mL Inject 300 mg into the vein every 28 (twenty-eight) days.    Marland Kitchen sertraline (ZOLOFT) 50 MG tablet Take 2 tablets (100 mg total) by mouth daily. 180 tablet 3   No facility-administered medications prior to visit.     PAST MEDICAL HISTORY: Past Medical History:  Diagnosis Date  . Suzanne (multiple sclerosis) ([place]) 07/13/12  . Uterine cancer ([place]) 2000    PAST SURGICAL HISTORY: Past Surgical History:  Procedure Laterality Date  . ABDOMINAL HYSTERECTOMY  2000    FAMILY HISTORY: Family History  Problem Relation Age of Onset  . Lung cancer Father     SOCIAL HISTORY: Social History   Social History  . Marital status: Legally Separated    Spouse name: N/A  . Number of children: 2  . Years of education: 14   Occupational History  . banker   .  [employer]   Social History Main Topics  . Smoking status: Never Smoker  . Smokeless tobacco: Never Used  . Alcohol use No  . Drug use: No  . Sexual activity: Not on file   Other Topics Concern  . Not on file   Social History Narrative   Patient lives at home with her daughter. Patient has a two years of college education.   Right handed.   Caffeine- two sodas daily.   Patient is separated.      PHYSICAL EXAM  PHYSICAL EXAMNIATION:  Gen: NAD, conversant, well nourised, obese, well  groomed                     Cardiovascular: Regular rate rhythm, no peripheral edema, warm, nontender. Eyes: Conjunctivae clear without exudates or hemorrhage Neck: Supple, no carotid bruise. Pulmonary: Clear to auscultation bilaterally   NEUROLOGICAL EXAM:  MENTAL STATUS: Speech:    Speech is normal; fluent and spontaneous with normal comprehension.  Cognition:    The patient is oriented to person, place, and time;     recent and remote memory intact;     language fluent;     normal attention, concentration,     fund of knowledge.  CRANIAL NERVES: CN II: Visual fields are full to confrontation. Fundoscopic exam is normal with sharp discs and no vascular changes. pupils are 4 mm and briskly reactive to light. Visual acuity is 20/20 bilaterally. CN III, IV, VI: extraocular movement are normal. No ptosis. CN V: Facial sensation is intact to pinprick in all 3 divisions bilaterally. Corneal responses are intact.  CN VII: Face is symmetric with normal eye closure and smile. CN VIII: Hearing is normal to rubbing fingers CN IX, X: Palate elevates symmetrically. Phonation is normal. CN XI: Head turning and  shoulder shrug are intact CN XII: Tongue is midline with normal movements and no atrophy.  MOTOR: She has mild bilateral lower extremity spasticity, no significant muscle weakness notice.  REFLEXES: Reflexes are 3 and symmetric at the biceps, triceps, knees, and ankles. Plantar responses are flexor.  SENSORY: Light touch, pinprick, position sense, and vibration sense are intact in fingers and toes.  COORDINATION: Rapid alternating movements and fine finger movements are intact. There is no dysmetria on finger-to-nose and heel-knee-shin.   GAIT/STANCE: Posture is normal. Gait is steady, is not able to perform tiptoe, heel walking, Romberg is present,  DIAGNOSTIC DATA (LABS, IMAGING, TESTING)  Tysabri since December 2013 stopped in October 2018.   JC virus antibody: negative  in Jul 19, 2102.  intermediate on March 2015.  Negative Sep 2015,  negative August 23 2014,  negative with titer of 0.19 on December 04 2015,  Negative with titer of 0.18 on August 24 2016   MRI brain w/wo in Sep 2016:   Numerous round and ovoid periventricular, subcortical and peri-callosal chronic demyelinating plaques. Several of these are hypointense on T1 views.  No abnormal lesions are seen on post contrast views.   Compared to MRI on 07/13/12, there are several (at least 4 left hemisphere) new chronic plaques noted in the current study, and not clearly seen in the prior study.  MRI brain w/wo August 2017: Multiple round and ovoid periventricular and subcortical chronic demyelinating plaques. Some of these are hypointense on T1 views.  No acute plaques. 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 Over all no change compared to MRI from 05/15/15.   MRI cervical w/wo Oct 2014: Left paracentral disc protrusion at C5-6 but without significant compression. Ill-defined spinal cord hyperintensities at C3-4 as well as C5 and C7 may represent remote age demyelinating plaques.  No enhancing lesions are noted.  MRI brain with without contrast in September 30 2016 Increased T2 FLAIR lesions, 6 enhancing lesions    ASSESSMENT AND PLAN  46 y.o. year  old female  Relapsing remitting multiple sclerosis, high risk medication management  She is involving research trial, [id number], which is an aminoethyl ester of monomethyl fumarate (MMF), started since Jan 24th 2018, was treated with Tecfidera for 6 weeks prior to [place] in Dec 2017  Last Tysarbri infusion was in October 2017   she is applying for social disability   Worsening depression  Increased Zoloft to 50 mg 3 tablets daily   chronic migraine headaches   Imitrex 50 mg as needed   Marcial Pacas, M.D. Ph.D.  [facility] [place], [place] [address] Phone: [phone] Fax:      [phone]  CC: Primary Care physician Dr. Octavio Graves, DO

## 2016-11-25 ENCOUNTER — Encounter (INDEPENDENT_AMBULATORY_CARE_PROVIDER_SITE_OTHER): Payer: Self-pay | Admitting: Neurology

## 2016-11-25 DIAGNOSIS — F329 Major depressive disorder, single episode, unspecified: Secondary | ICD-10-CM

## 2016-11-25 DIAGNOSIS — R252 Cramp and spasm: Secondary | ICD-10-CM

## 2016-11-25 DIAGNOSIS — F32A Depression, unspecified: Secondary | ICD-10-CM

## 2016-11-25 DIAGNOSIS — R269 Unspecified abnormalities of gait and mobility: Secondary | ICD-10-CM

## 2016-11-25 DIAGNOSIS — G35 Multiple sclerosis: Secondary | ICD-10-CM

## 2016-11-25 NOTE — Progress Notes (Signed)
No chief complaint on file.    [facility]  PATIENT: Suzanne Robbins DOB: 1971-05-18   REASON FOR VISIT: Followup for multiple sclerosis   HISTORY OF PRESENT ILLNESS: Suzanne Robbins, 46 -year-old female with history of relapsing remitting multiple sclerosis   In August 2013, she noticed numbness of her left foot, intermittent, faded away in 2 weeks  In September 2013, she noticed left arm numbness, itching underneath her skin, lasting for 2 weeks  In October 2013, she began to notice unbalanced gait, dizziness, progressively worse, woke up one morning noticed double vision, worsening balance difficulty, binocular diplopia, spinning sensation, profound gait difficulty, fatigue, nausea   She was admitted to [facility], July 13 2012 .  MRI brain ([place]) showed numerous white matter lesions which are predominately periventricular location suspicious for chronic Suzanne, with suspected acute demyelinating lesion in the right temporal lobe periventricular optic radiation. Mild premature atrophy. No contrast was given. MRA brain was normal.  [facility], Jul 27, 2012 MRI of the brain showed Multiple chronic periventricular and subcortical chronic demyelinating plaques.There are 3 enhancing lesions likely acute demyelinating plaques in the right periatrial, left posterior frontal and left occipital regions  MRI cervical spine showed Multiple chronic demyelinating plaques at C2-3, C3, C4, C5-6, and C7. No contrast enhancement   Laboratory evaluation showed normal ESR 10, CMP, CBC, negative Lyme titer, acetylcholine receptor antibody, ANA, TSH,   CSF, WBC 3, RBC 12, negative cryptococcal antigen, 5 oligoclonal bands, total protein 29, glucose 65  VEP 07/25/2012: was within normal limits bilaterally. No evidence of conduction slowing was seen within the anterior visual pathways  Tysabri since December 2013.  JC virus antibody was negative in Jul 19, 2102. antibody was intermediate  on March 2015. 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 No enhancing lesions are noted. The presence of several T1 black holes and mild generalized atrophy indicates chronic disease. Compared with previous MRI scan dated 07/13/2012 there appears to be minimum progression of disease activity  She was treated with provigil 100 mg twice a day in 2016, which has helped her fatigue,  repeat MRI of the brain with without contrast in September 2016 Numerous round and ovoid periventricular, subcortical and peri-callosal chronic demyelinating plaques. Several of these are hypointense on T1 views. No abnormal lesions are seen on post contrast views.  Compared to MRI on 07/13/12, there are several (at least 4 left hemisphere) new chronic plaques noted in the current study, and not clearly seen in the prior study.--No clinical flareup reported  UPDATE Sept 28th 2017: She now has a new primary care at [facility] Dr. Octavio Graves, we reviewed previous laboratory evaluation in March 2017, continue sliding trending up of abnormal AST 88, ALT 115,  JC virus was negative with titer of 0.19 on December 04 2015,  We have personally reviewed repeat MRI of the brain with and without contrast in September 2017, no change in comparison with 2016, She is concerned about high co-pay with [name] infusion, wants to consider other treatment options, including by mouth forms  UPDATE November 25 2016:  Patients came in for Suzanne 12 follow-up, she has stopped her Tysarbri infusion because of the high co-pay, she is currently unemployed, complains of anxiety, depression,  She also complains  of significant mental difficulty, difficulty focusing, intact with people, difficulty with multitasking, moderate  fatigue, continue have spasticity of bilateral lower shins, mild gait abnormality,  REVIEW OF SYSTEMS: Full 14 system review of systems performed and notable only for those listed, all others are neg:  As above  ALLERGIES: No Known Allergies  HOME MEDICATIONS: Outpatient Medications Prior to Visit  Medication Sig Dispense Refill  . estradiol (ESTRACE) 2 MG tablet Take 2 mg by mouth daily.    . ibuprofen (ADVIL,MOTRIN) 200 MG tablet Take 400 mg by mouth every 6 (six) hours as needed. For pain    . natalizumab 300 mg in sodium chloride 0.9 % 100 mL Inject 300 mg into the vein every 28 (twenty-eight) days.    . sertraline (ZOLOFT) 50 MG tablet Take 3 tablets (150 mg total) by mouth daily. 90 tablet 11  . SUMAtriptan (IMITREX) 50 MG tablet Take 1 tablet (50 mg total) by mouth every 2 (two) hours as needed for migraine. May repeat in 2 hours if headache persists or recurs. 10 tablet 0   No facility-administered medications prior to visit.     PAST MEDICAL HISTORY: Past Medical History:  Diagnosis Date  . Suzanne (multiple sclerosis) ([facility]) 07/13/12  . Uterine cancer ([facility]) 2000    PAST SURGICAL HISTORY: Past Surgical History:  Procedure Laterality Date  . ABDOMINAL HYSTERECTOMY  2000    FAMILY HISTORY: Family History  Problem Relation Age of Onset  . Lung cancer Father     SOCIAL HISTORY: Social History   Social History  . Marital status: Legally Separated    Spouse name: N/A  . Number of children: 2  . Years of education: 14   Occupational History  . banker   .  [employer]   Social History Main Topics  . Smoking status: Never Smoker  . Smokeless tobacco: Never Used  . Alcohol use No  . Drug use: No  . Sexual activity: Not on file   Other Topics Concern  . Not on file   Social History Narrative   Patient lives at home with her daughter. Patient has a two years of college education.   Right handed.   Caffeine- two sodas daily.   Patient is separated.       PHYSICAL EXAM  PHYSICAL EXAMNIATION:  Gen: NAD, conversant, well nourised, obese, well groomed                     Cardiovascular: Regular rate rhythm, no peripheral edema, warm, nontender. Eyes: Conjunctivae clear without exudates or hemorrhage Neck: Supple, no carotid bruise. Pulmonary: Clear to auscultation bilaterally   NEUROLOGICAL EXAM:  MENTAL STATUS: Speech:    Speech is normal; fluent and spontaneous with normal comprehension.  Cognition:    The patient is oriented to person, place, and time;     recent and remote memory intact;     language fluent;     normal attention, concentration,     fund of knowledge.  CRANIAL NERVES: CN II: Visual fields are full to confrontation. Fundoscopic exam is normal with sharp discs and no vascular changes. pupils are 4 mm and briskly reactive to light. Visual acuity is 20/20 bilaterally. CN III, IV, VI: extraocular movement are normal. No ptosis. CN V: Facial sensation is intact to pinprick in all 3 divisions bilaterally. Corneal responses are intact.  CN VII: Face is symmetric with normal eye closure and smile. CN VIII: Hearing is normal to rubbing fingers CN IX, X: Palate   elevates symmetrically. Phonation is normal. CN XI: Head turning and shoulder shrug are intact CN XII: Tongue is midline with normal movements and no atrophy.  MOTOR: She has mild bilateral lower extremity spasticity, no significant muscle weakness notice.  REFLEXES: Reflexes are 3 and symmetric at the biceps, triceps, knees, and ankles. Plantar responses are flexor.  SENSORY: Light touch, pinprick, position sense, and vibration sense are intact in fingers and toes.  COORDINATION: Rapid alternating movements and fine finger movements are intact. There is no dysmetria on finger-to-nose and heel-knee-shin.   GAIT/STANCE: Posture is normal. Gait is steady, is able to perform tiptoe, heel walking, Romberg is absent.  DIAGNOSTIC DATA (LABS, IMAGING,  TESTING)  Tysabri since December 2013.  JC virus antibody was negative in Jul 19, 2102. antibody was intermediate on March 2015. Negative Sep 2015, negative August 23 2014, negative with titer of 0.19 on December 04 2015,  MRI brain w/wo in Sep 2016:   Numerous round and ovoid periventricular, subcortical and peri-callosal chronic demyelinating plaques. Several of these are hypointense on T1 views.  No abnormal lesions are seen on post contrast views.   Compared to MRI on 07/13/12, there are several (at least 4 left hemisphere) new chronic plaques noted in the current study, and not clearly seen in the prior study.  MRI brain w/wo August 2017: Multiple round and ovoid periventricular and subcortical chronic demyelinating plaques. Some of these are hypointense on T1 views.  No acute plaques. 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 Over all no change compared to MRI from 05/15/15.   MRI cervical w/wo Oct 2014: Left paracentral disc protrusion at C5-6 but without significant compression. Ill-defined spinal cord hyperintensities at C3-4 as well as C5 and C7 may represent remote age demyelinating plaques.  No enhancing lesions are noted.  ASSESSMENT AND PLAN  46 y.o. year  old female  Relapsing remitting multiple sclerosis, high risk medication management  She is multiple sclerosis trial now,  Could not afford [name] due to high co-pay  Currently only employee, she also suffered significant mental slowing, cognitive impairment, difficulty with multitasking, chronic fatigue, bilateral lower extremity spasticity, gait abnormality, depression.      Marcial Pacas, M.D. Ph.D.  [facility] [place], [place] [address] Phone: [phone] Fax:      [phone]  CC: Primary Care physician Dr. Octavio Graves, DO

## 2016-12-21 ENCOUNTER — Telehealth: Payer: Self-pay | Admitting: *Deleted

## 2016-12-21 ENCOUNTER — Encounter: Payer: Self-pay | Admitting: Neurology

## 2016-12-21 NOTE — Telephone Encounter (Signed)
Left message that our office is without power this morning.  Suzanne Robbins in our research dept will call her back to reschedule.

## 2016-12-30 ENCOUNTER — Encounter (INDEPENDENT_AMBULATORY_CARE_PROVIDER_SITE_OTHER): Payer: Self-pay | Admitting: Neurology

## 2016-12-30 ENCOUNTER — Encounter: Payer: Self-pay | Admitting: Neurology

## 2016-12-30 DIAGNOSIS — Z0289 Encounter for other administrative examinations: Secondary | ICD-10-CM

## 2016-12-30 DIAGNOSIS — G35 Multiple sclerosis: Secondary | ICD-10-CM

## 2016-12-30 NOTE — Progress Notes (Signed)
See separate report for research

## 2017-01-25 ENCOUNTER — Encounter (INDEPENDENT_AMBULATORY_CARE_PROVIDER_SITE_OTHER): Payer: Self-pay | Admitting: Neurology

## 2017-01-25 DIAGNOSIS — Z0289 Encounter for other administrative examinations: Secondary | ICD-10-CM

## 2017-01-25 DIAGNOSIS — G35 Multiple sclerosis: Secondary | ICD-10-CM

## 2017-01-25 NOTE — Progress Notes (Signed)
See separate research documentation

## 2017-01-28 ENCOUNTER — Telehealth: Payer: Self-pay | Admitting: Neurology

## 2017-01-28 NOTE — Telephone Encounter (Signed)
Review of laboratory evaluations, fasting lipid profile  In March, cholesterol 259 LDL 155, triglycerides 185  In [place] 25 2018, HDL 67, triglycerides 142 cholesterol 276 LDL 181  Most recent in May 2018, HDL 84, triglyceride 152 cholesterol 309, LDL 195                                                                            My office will notify patient is trending up of her most recent fasting lipid profile, advised her continued diet, exercise

## 2017-02-18 ENCOUNTER — Encounter (INDEPENDENT_AMBULATORY_CARE_PROVIDER_SITE_OTHER): Payer: Self-pay | Admitting: Neurology

## 2017-02-18 DIAGNOSIS — Z0289 Encounter for other administrative examinations: Secondary | ICD-10-CM

## 2017-02-18 MED ORDER — MODAFINIL 100 MG PO TABS: 200.0000 mg | ORAL_TABLET | Freq: Every day | ORAL | 5 refills | Status: DC

## 2017-02-18 MED ORDER — MODAFINIL 200 MG PO TABS: 200.0000 mg | ORAL_TABLET | Freq: Every day | ORAL | 5 refills | Status: DC

## 2017-03-02 ENCOUNTER — Telehealth: Payer: Self-pay | Admitting: *Deleted

## 2017-03-02 NOTE — Telephone Encounter (Signed)
PA approved for Modafinil by [employer] [id number]) through 03/02/18.  PT UO#[id number].  MC#[id number].  Pharmacy has been notified.

## 2017-03-22 ENCOUNTER — Telehealth: Payer: Self-pay | Admitting: Neurology

## 2017-03-22 ENCOUNTER — Other Ambulatory Visit: Payer: Self-pay | Admitting: Neurology

## 2017-03-22 ENCOUNTER — Encounter (INDEPENDENT_AMBULATORY_CARE_PROVIDER_SITE_OTHER): Payer: Self-pay | Admitting: Neurology

## 2017-03-22 DIAGNOSIS — G35 Multiple sclerosis: Secondary | ICD-10-CM

## 2017-03-22 DIAGNOSIS — Z0289 Encounter for other administrative examinations: Secondary | ICD-10-CM

## 2017-03-22 MED ORDER — RANITIDINE HCL 75 MG PO TABS: 75.0000 mg | ORAL_TABLET | Freq: Two times a day (BID) | ORAL | 6 refills | Status: DC

## 2017-03-22 MED ORDER — PREDNISONE 50 MG PO TABS: ORAL_TABLET | ORAL | 0 refills | Status: DC

## 2017-03-22 MED ORDER — ALPRAZOLAM 0.5 MG PO TABS: 0.5000 mg | ORAL_TABLET | Freq: Three times a day (TID) | ORAL | 3 refills | Status: DC | PRN

## 2017-03-22 NOTE — Telephone Encounter (Signed)
She had exacerbation while taking monmethyl fumarate  Will have repeat MRI of the brain with and without contrast through research  May consider switch her to IV oclizumab She is in the process of applying for disability, likely to be approved in August 2018,  She is now treated with by mouth prednisone since March 22 2017, please looking to the process of receiving IV infusion, previously she could not afford IV infusion of [name]

## 2017-03-22 NOTE — Progress Notes (Signed)
No chief complaint on file.    [facility]  PATIENT: Suzanne Robbins DOB: September 11, 1970   REASON FOR VISIT: Followup for multiple sclerosis   HISTORY OF PRESENT ILLNESS: Suzanne Robbins, 46 -year-old female with history of relapsing remitting multiple sclerosis   In August 2013, she noticed numbness of her left foot, intermittent, faded away in 2 weeks  In September 2013, she noticed left arm numbness, itching underneath her skin, lasting for 2 weeks  In October 2013, she began to notice unbalanced gait, dizziness, progressively worse, woke up one morning noticed double vision, worsening balance difficulty, binocular diplopia, spinning sensation, profound gait difficulty, fatigue, nausea   She was admitted to [facility], July 13 2012 .  MRI brain ([place]) showed numerous white matter lesions which are predominately periventricular location suspicious for chronic Suzanne, with suspected acute demyelinating lesion in the right temporal lobe periventricular optic radiation. Mild premature atrophy. No contrast was given. MRA brain was normal.  [facility], Jul 27, 2012 MRI of the brain showed Multiple chronic periventricular and subcortical chronic demyelinating plaques.There are 3 enhancing lesions likely acute demyelinating plaques in the right periatrial, left posterior frontal and left occipital regions  MRI cervical spine showed Multiple chronic demyelinating plaques at C2-3, C3, C4, C5-6, and C7. No contrast enhancement   Laboratory evaluation showed normal ESR 10, CMP, CBC, negative Lyme titer, acetylcholine receptor antibody, ANA, TSH,   CSF, WBC 3, RBC 12, negative cryptococcal antigen, 5 oligoclonal bands, total protein 29, glucose 65  VEP 07/25/2012: was within normal limits bilaterally. No evidence of conduction slowing was seen within the anterior visual pathways  Tysabri since December 2013.  JC virus antibody was negative in Jul 19, 2102. antibody was intermediate  on March 2015. 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 No enhancing lesions are noted. The presence of several T1 black holes and mild generalized atrophy indicates chronic disease. Compared with previous MRI scan dated 07/13/2012 there appears to be minimum progression of disease activity  She was treated with provigil 100 mg twice a day in 2016, which has helped her fatigue,  repeat MRI of the brain with without contrast in September 2016 Numerous round and ovoid periventricular, subcortical and peri-callosal chronic demyelinating plaques. Several of these are hypointense on T1 views. No abnormal lesions are seen on post contrast views.  Compared to MRI on 07/13/12, there are several (at least 4 left hemisphere) new chronic plaques noted in the current study, and not clearly seen in the prior study.--No clinical flareup reported  UPDATE Sept 28th 2017: She now has a new primary care at [facility] Dr. Octavio Graves, we reviewed previous laboratory evaluation in March 2017, continue sliding trending up of abnormal AST 88, ALT 115,  JC virus was negative with titer of 0.19 on December 04 2015,  We have personally reviewed repeat MRI of the brain with and without contrast in September 2017, no change in comparison with 2016, She is concerned about high co-pay with [name] infusion, wants to consider other treatment options, including by mouth forms  Update March 22 2017: She was enrolled into research since November 2017, began to receive treatment since December 2017, in January, she had a flareup of worsening gait abnormality, repeat MRI of the brain with  and without contrast on September 30 2016 showed multiple hyperintensity foci, 6 of foci enhancement, which was not  present on previous MRIs on August 13 2016,  She was treated with IV steroid 4 day in Jan 2018, with great recovery  Since June 2018, she noticed gradually worsening fatigue, gait abnormality, left leg weakness, right eye pain, color washing out, which has been persistent since then,  Today she has to walk with a single foot cane up to 300 m, significant fatigue,  REVIEW OF SYSTEMS: Full 14 system review of systems performed and notable only for those listed, all others are neg:   fatigue  ALLERGIES: No Known Allergies  HOME MEDICATIONS: Outpatient Medications Prior to Visit  Medication Sig Dispense Refill  . estradiol (ESTRACE) 2 MG tablet Take 2 mg by mouth daily.    Marland Kitchen ibuprofen (ADVIL,MOTRIN) 200 MG tablet Take 400 mg by mouth every 6 (six) hours as needed. For pain    . modafinil (PROVIGIL) 200 MG tablet Take 1 tablet (200 mg total) by mouth daily. 90 tablet 5  . natalizumab 300 mg in sodium chloride 0.9 % 100 mL Inject 300 mg into the vein every 28 (twenty-eight) days.    Marland Kitchen sertraline (ZOLOFT) 50 MG tablet Take 3 tablets (150 mg total) by mouth daily. 90 tablet 11  . SUMAtriptan (IMITREX) 50 MG tablet Take 1 tablet (50 mg total) by mouth every 2 (two) hours as needed for migraine. May repeat in 2 hours if headache persists or recurs. 10 tablet 0   No facility-administered medications prior to visit.     PAST MEDICAL HISTORY: Past Medical History:  Diagnosis Date  . Suzanne (multiple sclerosis) ([place]) 07/13/12  . Uterine cancer ([place]) 2000    PAST SURGICAL HISTORY: Past Surgical History:  Procedure Laterality Date  . ABDOMINAL HYSTERECTOMY  2000    FAMILY HISTORY: Family History  Problem Relation Age of Onset  . Lung cancer Father     SOCIAL HISTORY: Social History   Social History  . Marital status: Legally Separated    Spouse name: N/A  . Number of children: 2  . Years of education: 14   Occupational History  . banker   .  [employer]   Social History Main  Topics  . Smoking status: Never Smoker  . Smokeless tobacco: Never Used  . Alcohol use No  . Drug use: No  . Sexual activity: Not on file   Other Topics Concern  . Not on file   Social History Narrative   Patient lives at home with her daughter. Patient has a two years of college education.   Right handed.   Caffeine- two sodas daily.   Patient is separated.      PHYSICAL EXAM  PHYSICAL EXAMNIATION:  Gen: NAD, conversant, well nourised, obese, well groomed                     Cardiovascular: Regular rate rhythm, no peripheral edema, warm, nontender. Eyes: Conjunctivae clear without exudates or hemorrhage Neck: Supple, no carotid bruise. Pulmonary: Clear to auscultation bilaterally   NEUROLOGICAL EXAM:  MENTAL STATUS: Speech:    Speech is normal; fluent and spontaneous with normal comprehension.  Cognition:    The patient is oriented to person, place, and time;     recent and remote memory intact;     language fluent;     normal attention, concentration,     fund of knowledge.  CRANIAL NERVES: CN II: Visual fields are  full to confrontation. Fundoscopic exam is normal with sharp discs and no vascular changes. pupils are 4 mm and briskly reactive to light. Visual acuity is 20/20 bilaterally. CN III, IV, VI: extraocular movement are normal. No ptosis. CN V: Facial sensation is intact to pinprick in all 3 divisions bilaterally. Corneal responses are intact.  CN VII: Face is symmetric with normal eye closure and smile. CN VIII: Hearing is normal to rubbing fingers CN IX, X: Palate elevates symmetrically. Phonation is normal. CN XI: Head turning and shoulder shrug are intact CN XII: Tongue is midline with normal movements and no atrophy.  MOTOR: Mild bilateral lower extremity spasticity, mild left hip flexion, knee flexion extension ankle dorsiflexion weakness  REFLEXES: Reflexes are 3 and symmetric at the biceps, triceps, knees, and ankles. Plantar responses are  flexor.  SENSORY: Decreased vibratory sensation, especially at the left lower extremity, decreased sharp and dull discrimination on the left side  COORDINATION: Rapid alternating movements and fine finger movements are intact. There is no dysmetria on finger-to-nose and heel-knee-shin.   GAIT/STANCE: Wide-based, unsteady gait, difficulty performing tandem walking, she is able to walk 100 m by less than 200 m without assistance, rely on 1 foot cane for prolonged walking  DIAGNOSTIC DATA (LABS, IMAGING, TESTING)  Tysabri since December 2013.  JC virus antibody was negative in Jul 19, 2102. antibody was intermediate on March 2015. Negative Sep 2015, negative August 23 2014, negative with titer of 0.19 on December 04 2015,  MRI brain w/wo in Sep 2016:   Numerous round and ovoid periventricular, subcortical and peri-callosal chronic demyelinating plaques. Several of these are hypointense on T1 views.  No abnormal lesions are seen on post contrast views.   Compared to MRI on 07/13/12, there are several (at least 4 left hemisphere) new chronic plaques noted in the current study, and not clearly seen in the prior study.  MRI brain w/wo August 2017: Multiple round and ovoid periventricular and subcortical chronic demyelinating plaques. Some of these are hypointense on T1 views.  No acute plaques. 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 Over all no change compared to MRI from 05/15/15.   MRI cervical w/wo Oct 2014: Left paracentral disc protrusion at C5-6 but without significant compression. Ill-defined spinal cord hyperintensities at C3-4 as well as C5 and C7 may represent remote age demyelinating plaques.  No enhancing lesions are noted.  ASSESSMENT AND PLAN  46 y.o. year  old female  Relapsing remitting multiple sclerosis, high risk medication management  Now in research [phone], active form of monomethyl  fumarate  Flareup in January 2018, recurrent flareup since June 2018, worsening gait difficulty, left lower extremity weakness,   Repeat MRI of the brain with and without contrast  She is in the process of disability process, may consider IV form, such as Oclizumab vs Tysarbri  Po steroid.  Worsening fatigue  Depression  Continue Zoloft 50 mge every day    Marcial Pacas, M.D. Ph.D.  [facility] [place], [place] [address] Phone: [phone] Fax:      [phone]  CC: Primary Care physician Dr. Octavio Graves, DO

## 2017-03-23 ENCOUNTER — Ambulatory Visit (INDEPENDENT_AMBULATORY_CARE_PROVIDER_SITE_OTHER): Payer: Self-pay | Admitting: Neurology

## 2017-03-23 DIAGNOSIS — Z0289 Encounter for other administrative examinations: Secondary | ICD-10-CM

## 2017-03-23 DIAGNOSIS — G35 Multiple sclerosis: Secondary | ICD-10-CM

## 2017-03-24 ENCOUNTER — Ambulatory Visit
Admission: RE | Admit: 2017-03-24 | Discharge: 2017-03-24 | Disposition: A | Discharge status: Home / Self Care | Payer: No Typology Code available for payment source | Source: Ambulatory Visit | Attending: Neurology | Admitting: Neurology

## 2017-03-24 DIAGNOSIS — G35 Multiple sclerosis: Secondary | ICD-10-CM

## 2017-03-24 NOTE — Telephone Encounter (Signed)
Spoke to the patient - she is coming to the office on 03/25/17 to sign her paperwork and have her TB lab drawn.

## 2017-03-24 NOTE — Addendum Note (Signed)
Addended by: Marcial Pacas on: 03/24/2017 08:58 AM   Modules accepted: Orders

## 2017-03-24 NOTE — Telephone Encounter (Addendum)
Please let her have ocrelizumab as soon as we can.  I ordered quantiferon gold TB blood test, rest of the labs were ready through research

## 2017-03-25 ENCOUNTER — Telehealth: Payer: Self-pay | Admitting: Neurology

## 2017-03-25 ENCOUNTER — Other Ambulatory Visit (INDEPENDENT_AMBULATORY_CARE_PROVIDER_SITE_OTHER): Payer: Self-pay

## 2017-03-25 ENCOUNTER — Other Ambulatory Visit: Payer: Self-pay | Admitting: *Deleted

## 2017-03-25 DIAGNOSIS — Z0289 Encounter for other administrative examinations: Secondary | ICD-10-CM

## 2017-03-25 DIAGNOSIS — G35 Multiple sclerosis: Secondary | ICD-10-CM

## 2017-03-25 MED ORDER — CLONAZEPAM 0.5 MG PO TABS: ORAL_TABLET | ORAL | 0 refills | Status: DC

## 2017-03-25 NOTE — Telephone Encounter (Addendum)
Dr. Krista Blue has provided clonazepam 0.5mg , 1-2 tabs qhs prn to help with sleep.  Her labs are complete and the Ocrevus paperwork signed.  We have requested expedited approval with [employer].  Patient is aware.

## 2017-03-25 NOTE — Telephone Encounter (Signed)
Spoke to patient - she is aware of the MRI results and the medication recommendations.

## 2017-03-25 NOTE — Progress Notes (Signed)
See separate research documentation.

## 2017-03-25 NOTE — Telephone Encounter (Signed)
Pt calling to inform RN Sharyn Lull that she came for labs today, signed some forms and wants to know if there is anything else that is needed.  Pt also is wanting something called in to help her sleep at night due to high dosages of other medications, please call

## 2017-03-25 NOTE — Telephone Encounter (Signed)
We have personally reviewed MRI of the brain with and without contrast in comparison to previous MRIs 2018: Findings consistent with multiple sclerosis, there are 4 small foci developed during interview, left splenium contrast enhancement,  She is to continue prednisone tapering as planned, clonazepam 0.5 milligrams 1-2 tablets every night as needed for insomnia, Zantac 75 mg twice a day, for GI side effect  Will try to start her on Ocrelizumab as soon as possible    IMPRESSION:  This is an abnormal MRI of the brain with and without contrast showing multiple T2/FLAIR hyperintense foci in a pattern and configuration consistent with demyelinating plaques associated with multiple sclerosis. When compared to the MRI dated 09/30/2016, there are at least 4 foci that have developed during the interim.  One of these in the left splenium enhances consistent with an acute demyelinating focus. 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

## 2017-03-30 LAB — QUANTIFERON IN TUBE
QUANTIFERON MITOGEN VALUE: 6.63 [IU]/mL
QUANTIFERON NIL VALUE: 0.05 [IU]/mL
QUANTIFERON TB AG VALUE: 0.04 IU/mL
QUANTIFERON TB GOLD: NEGATIVE

## 2017-03-30 LAB — QUANTIFERON TB GOLD ASSAY (BLOOD)

## 2017-04-01 ENCOUNTER — Other Ambulatory Visit: Payer: Self-pay

## 2017-04-01 NOTE — Telephone Encounter (Signed)
Provided information to [employer] to make the return call.  They are in the process of getting her medication authorized.

## 2017-04-01 NOTE — Telephone Encounter (Signed)
Katharine Look with [employer] called in reference to [facility] authorization has a few questions and would like a returned call.. Please call [phone] ext: [phone]

## 2017-04-07 ENCOUNTER — Encounter (INDEPENDENT_AMBULATORY_CARE_PROVIDER_SITE_OTHER): Payer: Self-pay | Admitting: Neurology

## 2017-04-07 DIAGNOSIS — Z0289 Encounter for other administrative examinations: Secondary | ICD-10-CM

## 2017-04-07 DIAGNOSIS — G35 Multiple sclerosis: Secondary | ICD-10-CM

## 2017-04-07 NOTE — Progress Notes (Signed)
This is the end of research visit.  She was treated with high-dose by mouth prednisone tapering since March 25 2017 responded very well, she is almost back to her baseline, no significant gait abnormality,  Her disability application is denied, is working with attorney now,  Wess Botts is approved, is working on patient assistance paperwork  Last dose of research medication was on March 22 2017

## 2017-04-20 ENCOUNTER — Other Ambulatory Visit: Payer: Self-pay | Admitting: Neurology

## 2017-04-21 ENCOUNTER — Other Ambulatory Visit: Payer: Self-pay | Admitting: *Deleted

## 2017-04-21 MED ORDER — CLONAZEPAM 0.5 MG PO TABS: ORAL_TABLET | ORAL | 5 refills | Status: DC

## 2017-04-22 ENCOUNTER — Encounter: Payer: Self-pay | Admitting: Neurology

## 2017-04-23 ENCOUNTER — Telehealth: Payer: Self-pay | Admitting: Neurology

## 2017-04-23 NOTE — Telephone Encounter (Signed)
Glen/[facility] called the clinic said pt had xanax filled 815 and has brought RX for klonopin to get filled today. Call transferred to MaryClare/RN.

## 2017-04-23 NOTE — Telephone Encounter (Signed)
Received call from [place], pharmacist at [facility] He stated the patient recently refilled a Xanax Rx and has brought in a Rx for Klonopin today. Monica Martinez stated he wants clarification if patient is actually on both medications because he stated, that is unusual. This RN was unable to confirm absolutely patient is to be on both, although did advise Monica Martinez the Xanax is for anxiety, and the Klonopin is for sleep. Glen requested the dr be consulted and he get a call back. This RN stated she would.

## 2017-04-23 NOTE — Telephone Encounter (Signed)
Spoke with Suzanne Robbins and gave him Dr Rhea Belton reply. He repeated, stated he would cancel Xanax refills. He verbalized understanding.

## 2017-04-23 NOTE — Telephone Encounter (Signed)
Last prescription of Xanax was on March 22 2017, 90 tablets, with 3 refill, she should not take Xanax regularly, clonazepam 0.5 mg at night for sleep, please cancel previous xanax refill order.

## 2017-05-05 ENCOUNTER — Encounter: Payer: Self-pay | Admitting: *Deleted

## 2017-05-05 ENCOUNTER — Telehealth: Payer: Self-pay | Admitting: *Deleted

## 2017-05-05 ENCOUNTER — Other Ambulatory Visit: Payer: Self-pay | Admitting: *Deleted

## 2017-05-05 ENCOUNTER — Telehealth: Payer: Self-pay | Admitting: Neurology

## 2017-05-05 MED ORDER — ALPRAZOLAM 0.5 MG PO TABS: 0.5000 mg | ORAL_TABLET | Freq: Three times a day (TID) | ORAL | 5 refills | Status: DC | PRN

## 2017-05-05 MED ORDER — ZOLPIDEM TARTRATE 5 MG PO TABS: 5.0000 mg | ORAL_TABLET | Freq: Every evening | ORAL | 5 refills | Status: DC | PRN

## 2017-05-05 NOTE — Telephone Encounter (Signed)
Suzanne Robbins with [facility] in [place] is calling to discuss medication zolpidem (AMBIEN) 5 MG tablet. Is the patient still taking ALPRAZolam (XANAX) 0.5 MG tablet and generic Klonopin?

## 2017-05-05 NOTE — Telephone Encounter (Signed)
Dr. Krista Blue has made changes to patient's medication regimen.  Patient will continue Xanax during the day for anxiety and discontinue clonazepam at bedtime. Pt has been provided a prescription for Ambien to help her sleep at night.  Patient was in the infusion room when these changes were made and the prescriptions were given directly to her.

## 2017-05-05 NOTE — Telephone Encounter (Signed)
Returned call to [place] at pharmacy - he is aware that clonazepam has been discontinued.  She will use Xanax during the day as needed for anxiety and Ambien at bedtime as needed for insomnia.

## 2017-06-29 DIAGNOSIS — Z0271 Encounter for disability determination: Secondary | ICD-10-CM

## 2017-07-21 ENCOUNTER — Encounter: Payer: Self-pay | Admitting: *Deleted

## 2017-10-22 ENCOUNTER — Telehealth: Payer: Self-pay | Admitting: Neurology

## 2017-10-22 MED ORDER — OXYCODONE-ACETAMINOPHEN 5-325 MG PO TABS: 1.0000 | ORAL_TABLET | Freq: Two times a day (BID) | ORAL | 0 refills | Status: DC | PRN

## 2017-10-22 NOTE — Telephone Encounter (Signed)
Spoke with Suzanne Robbins.  She c/o Le[place]itte's sign.  Sts. the only thing that has every helped this sx. is Oxycodone, which she rarely takes.  Has not had rx. since 2016. Per YY, ok to fill. Rx. awaiting YY sig/fim

## 2017-10-22 NOTE — Telephone Encounter (Signed)
Pt has called stating re: her  Percocet she is out, she would like to know if she can get another prescription.  Please call

## 2017-10-22 NOTE — Addendum Note (Signed)
Addended by: France Ravens I on: 10/22/2017 12:57 PM   Modules accepted: Orders

## 2017-10-22 NOTE — Telephone Encounter (Signed)
Rx. up front [facility]/fim 

## 2017-11-16 ENCOUNTER — Other Ambulatory Visit: Payer: Self-pay | Admitting: Neurology

## 2017-11-16 NOTE — Telephone Encounter (Signed)
[place] narcotic registry checked.  No concerns noted.

## 2017-12-02 ENCOUNTER — Other Ambulatory Visit: Payer: Self-pay | Admitting: *Deleted

## 2017-12-02 MED ORDER — SERTRALINE HCL 50 MG PO TABS: 150.0000 mg | ORAL_TABLET | Freq: Every day | ORAL | 11 refills | Status: DC

## 2018-04-11 ENCOUNTER — Ambulatory Visit: Payer: 59 | Admitting: Neurology

## 2018-04-11 ENCOUNTER — Encounter: Payer: Self-pay | Admitting: Neurology

## 2018-04-11 VITALS — BP 134/88 | HR 77 | Ht 63.5 in | Wt 192.0 lb

## 2018-04-11 DIAGNOSIS — F329 Major depressive disorder, single episode, unspecified: Secondary | ICD-10-CM

## 2018-04-11 DIAGNOSIS — G35 Multiple sclerosis: Secondary | ICD-10-CM | POA: Diagnosis not present

## 2018-04-11 DIAGNOSIS — F32A Depression, unspecified: Secondary | ICD-10-CM

## 2018-04-11 MED ORDER — ALPRAZOLAM 0.5 MG PO TABS: 0.5000 mg | ORAL_TABLET | Freq: Three times a day (TID) | ORAL | 5 refills | Status: DC

## 2018-04-11 MED ORDER — SERTRALINE HCL 50 MG PO TABS: 150.0000 mg | ORAL_TABLET | Freq: Every day | ORAL | 4 refills | Status: DC

## 2018-04-11 NOTE — Progress Notes (Signed)
Chief Complaint  Patient presents with  . Multiple Sclerosis    Overall, she feels she is doing well on Ocrevus.  She has been under added stress (recent loss of her father due to unexpected drowning from a boating accident in June 2019, her daughter had an attempted suicide attempt from postpartum depression in Suzanne Robbins 2019 and had to be committed).  She has noticed some mild swallowing difficutly so she has to be careful with her diet. She has also developed intermittent ringing in her bilateral ears.     [facility]  PATIENT: Suzanne Robbins DOB: 1970-09-12  HISTORY OF PRESENT ILLNESS: [name], 47 -year-old female with history of relapsing remitting multiple sclerosis   In August 2013, she noticed numbness of her left foot, intermittent, faded away in 2 weeks  In September 2013, she noticed left arm numbness, itching underneath her skin, lasting for 2 weeks  In October 2013, she began to notice unbalanced gait, dizziness, progressively worse, woke up one morning noticed double vision, worsening balance difficulty, binocular diplopia, spinning sensation, profound gait difficulty, fatigue, nausea   She was admitted to [facility], July 13 2012 .  MRI brain ([place]) showed numerous white matter lesions which are predominately periventricular location suspicious for chronic MS, with suspected acute demyelinating lesion in the right temporal lobe periventricular optic radiation. Mild premature atrophy. No contrast was given. MRA brain was normal.  [facility], Jul 27, 2012 MRI of the brain showed Multiple chronic periventricular and subcortical chronic demyelinating plaques.There are 3 enhancing lesions likely acute demyelinating plaques in the right periatrial, left posterior frontal and left occipital regions  MRI cervical spine showed Multiple chronic demyelinating plaques at C2-3, C3, C4, C5-6, and C7. No contrast enhancement   Laboratory evaluation showed normal ESR  10, CMP, CBC, negative Lyme titer, acetylcholine receptor antibody, ANA, TSH,   CSF, WBC 3, RBC 12, negative cryptococcal antigen, 5 oligoclonal bands, total protein 29, glucose 65  VEP 07/25/2012: was within normal limits bilaterally. No evidence of conduction slowing was seen within the anterior visual pathways  Tysabri since December 2013.  JC virus antibody was negative in Jul 19, 2102. antibody was intermediate on March 2015. 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 No enhancing lesions are noted. The presence of several T1 black holes and mild generalized atrophy indicates chronic disease. Compared with previous MRI scan dated 07/13/2012 there appears to be minimum progression of disease activity  She was treated with provigil 100 mg twice a day in 2016, which has helped her fatigue,  Repeat MRI of the brain with without contrast in September 2016 Numerous round and ovoid periventricular, subcortical and peri-callosal chronic demyelinating plaques. Several of these are hypointense on T1 views. No abnormal lesions are seen on post contrast views.  Compared to MRI on 07/13/12, there are several (at least 4 left hemisphere) new chronic plaques noted in the current study, and not clearly seen in the prior study.--No clinical flareup reported  UPDATE Sept 28th 2017: She now has a new primary care at [facility] Dr. Octavio Graves, we reviewed previous laboratory evaluation in March 2017, continue sliding trending up of abnormal AST 88, ALT 115,  JC virus was negative with titer of 0.19 on December 04 2015,  We have  personally reviewed repeat MRI of the brain with and without contrast in September 2017, no change in comparison with 2016, She is concerned about high co-pay  with [name] infusion, wants to consider other treatment options, including by mouth forms  Update March 22 2017: She was enrolled into research since November 2017, began to receive treatment since December 2017, in January, she had a flareup of worsening gait abnormality, repeat MRI of the brain with and without contrast on September 30 2016 showed multiple hyperintensity foci, 6 of foci enhancement, which was not present on previous MRIs on August 13 2016,  She was treated with IV steroid 4 day in Jan 2018, with great recovery  Since June 2018, she noticed gradually worsening fatigue, gait abnormality, left leg weakness, right eye pain, color washing out, which has been persistent since then,  Today she has to walk with a single foot cane up to 300 m, significant fatigue  UPDATE April 11 2018: She is getting ocrelizumab since October 2018, every 6 months, had repeat infusion in March, and then September 2019.  He went on social disability since December 2017, she lives with her daughter, who has suffered postpartum depression, she is helping her with grandson.  She is doing very well with her ocrelizumab, no MS flareup, taking Zoloft 150 mg every day, Xanax as needed  We have personally reviewed MRI of brain with and without contrast in July 2018, there is multiple T2/flair consistent with chronic multiple sclerosis, compared to MRI in Jan 2018, there are 4 new foci that have developed.  REVIEW OF SYSTEMS: Full 14 system review of systems performed and notable only for those listed, all others are neg: appetite change, heat intolerance, memory loss, headache, decreased concentration, depression, anxiety  ALLERGIES: No Known Allergies  HOME MEDICATIONS: Outpatient Medications Prior to Visit  Medication Sig Dispense Refill  . ALPRAZolam (XANAX) 0.5 MG tablet TAKE 1 TABLET BY MOUTH THREE TIMES A DAY IF NEEDED FOR ANXIETY 90 tablet 5  . atorvastatin (LIPITOR) 20 MG tablet atorvastatin 20  mg tablet  TAKE 1 TABLET BY MOUTH EVERY DAY    . estradiol (ESTRACE) 2 MG tablet Take 2 mg by mouth daily.    Marland Kitchen ibuprofen (ADVIL,MOTRIN) 200 MG tablet Take 400 mg by mouth every 6 (six) hours as needed. For pain    . ocrelizumab 600 mg in sodium chloride 0.9 % 500 mL Inject 600 mg every 6 (six) months into the vein.    Marland Kitchen oxyCODONE-acetaminophen (PERCOCET/ROXICET) 5-325 MG tablet Take 1 tablet by mouth 2 (two) times daily as needed for severe pain. 60 tablet 0  . ranitidine (ZANTAC 75) 75 MG tablet Take 1 tablet (75 mg total) by mouth 2 (two) times daily. 60 tablet 6  . sertraline (ZOLOFT) 50 MG tablet Take 3 tablets (150 mg total) by mouth daily. 90 tablet 11  . modafinil (PROVIGIL) 200 MG tablet Take 1 tablet (200 mg total) by mouth daily. 90 tablet 5  . SUMAtriptan (IMITREX) 50 MG tablet Take 1 tablet (50 mg total) by mouth every 2 (two) hours as needed for migraine. May repeat in 2 hours if headache persists or recurs. 10 tablet 0  . zolpidem (AMBIEN) 5 MG tablet Take 1 tablet (5 mg total) by mouth at bedtime as needed for sleep. 30 tablet 5   No facility-administered medications prior to visit.     PAST MEDICAL HISTORY: Past Medical History:  Diagnosis Date  . MS (multiple sclerosis) ([place]) 07/13/12  .  Uterine cancer ([place]) 2000    PAST SURGICAL HISTORY: Past Surgical History:  Procedure Laterality Date  . ABDOMINAL HYSTERECTOMY  2000    FAMILY HISTORY: Family History  Problem Relation Age of Onset  . Lung cancer Father     SOCIAL HISTORY: Social History   Socioeconomic History  . Marital status: Legally Separated    Spouse name: Not on file  . Number of children: 2  . Years of education: 32  . Highest education level: Not on file  Occupational History  . Occupation: Administrator, sports: [employer]  . Financial resource strain: Not on file  . Food insecurity:    Worry: Not on file    Inability: Not on file  . Transportation needs:    Medical: Not on  file    Non-medical: Not on file  Tobacco Use  . Smoking status: Never Smoker  . Smokeless tobacco: Never Used  Substance and Sexual Activity  . Alcohol use: No  . Drug use: No  . Sexual activity: Not on file  Lifestyle  . Physical activity:    Days per week: Not on file    Minutes per session: Not on file  . Stress: Not on file  Relationships  . Social connections:    Talks on phone: Not on file    Gets together: Not on file    Attends religious service: Not on file    Active member of club or organization: Not on file    Attends meetings of clubs or organizations: Not on file    Relationship status: Not on file  . Intimate partner violence:    Fear of current or ex partner: Not on file    Emotionally abused: Not on file    Physically abused: Not on file    Forced sexual activity: Not on file  Other Topics Concern  . Not on file  Social History Narrative   Patient lives at home with her daughter. Patient has a two years of college education.   Right handed.   Caffeine- two sodas daily.   Patient is separated.      PHYSICAL EXAM  PHYSICAL EXAMNIATION:  Gen: NAD, conversant, well nourised, obese, well groomed                     Cardiovascular: Regular rate rhythm, no peripheral edema, warm, nontender. Eyes: Conjunctivae clear without exudates or hemorrhage Neck: Supple, no carotid bruise. Pulmonary: Clear to auscultation bilaterally   NEUROLOGICAL EXAM:  MENTAL STATUS: Speech:    Speech is normal; fluent and spontaneous with normal comprehension.  Cognition:    The patient is oriented to person, place, and time;     recent and remote memory intact;     language fluent;     normal attention, concentration,     fund of knowledge.  CRANIAL NERVES: CN II: Visual fields are full to confrontation. Fundoscopic exam is normal with sharp discs and no vascular changes. pupils are 4 mm and briskly reactive to light. Visual acuity is 20/20 bilaterally. CN III, IV, VI:  extraocular movement are normal. No ptosis. CN V: Facial sensation is intact to pinprick in all 3 divisions bilaterally. Corneal responses are intact.  CN VII: Robbins is symmetric with normal eye closure and smile. CN VIII: Hearing is normal to rubbing fingers CN IX, X: Palate elevates symmetrically. Phonation is normal. CN XI: Head turning and shoulder shrug are intact CN XII: Tongue is midline  with normal movements and no atrophy.  MOTOR: Mild bilateral lower extremity spasticity, mild left hip flexion, knee flexion extension ankle dorsiflexion weakness  REFLEXES: Reflexes are 3 and symmetric at the biceps, triceps, knees, and ankles. Plantar responses are flexor.  SENSORY: Decreased vibratory sensation, especially at the left lower extremity, decreased sharp and dull discrimination on the left side  COORDINATION: Rapid alternating movements and fine finger movements are intact. There is no dysmetria on finger-to-nose and heel-knee-shin.   GAIT/STANCE: Wide-based, unsteady gait, difficulty performing tandem walking, she is able to walk 100 m by less than 200 m without assistance, rely on 1 foot cane for prolonged walking  DIAGNOSTIC DATA (LABS, IMAGING, TESTING)  Tysabri since December 2013.  JC virus antibody was negative in Jul 19, 2102. antibody was intermediate on March 2015. Negative Sep 2015, negative August 23 2014, negative with titer of 0.19 on December 04 2015,  MRI brain w/wo in Sep 2016:   Numerous round and ovoid periventricular, subcortical and peri-callosal chronic demyelinating plaques. Several of these are hypointense on T1 views.  No abnormal lesions are seen on post contrast views.   Compared to MRI on 07/13/12, there are several (at least 4 left hemisphere) new chronic plaques noted in the current study, and not clearly seen in the prior study.  MRI brain w/wo August 2017: Multiple round and ovoid periventricular and subcortical chronic demyelinating plaques.  Some of these are hypointense on T1 views.  No acute plaques. 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 Over all no change compared to MRI from 05/15/15.   MRI cervical w/wo Oct 2014: Left paracentral disc protrusion at C5-6 but without significant compression. Ill-defined spinal cord hyperintensities at C3-4 as well as C5 and C7 may represent remote age demyelinating plaques.  No enhancing lesions are noted.  ASSESSMENT AND PLAN  47 y.o. year  old female  Relapsing remitting multiple sclerosis, high risk medication management  Flareup in January 2018, recurrent flareup since June 2018, worsening gait difficulty, left lower extremity weakness,   Started ocrelizumab since October 2018 doing very well  Laboratory evaluations  Repeat MRI of the brain with without contrast  Depression, anxiety  Continue Zoloft 50 mg 3 tabs every day  Xanax 0.71m tid as needed.    YMarcial Pacas M.D. Ph.D.  [facility] [address] [place] [id number]: [phone]:      [phone] CC: Primary Care physician Dr. COctavio Graves DO

## 2018-04-12 ENCOUNTER — Telehealth: Payer: Self-pay | Admitting: Neurology

## 2018-04-12 NOTE — Telephone Encounter (Signed)
[place]: [id number] (exp.04/12/18 to 05/27/18) order sent to GI. They will reach out to the pt to schedule.

## 2018-05-02 ENCOUNTER — Other Ambulatory Visit: Payer: Self-pay | Admitting: Neurology

## 2018-05-02 ENCOUNTER — Telehealth: Payer: Self-pay | Admitting: *Deleted

## 2018-05-02 MED ORDER — MODAFINIL 200 MG PO TABS
200.0000 mg | ORAL_TABLET | Freq: Every day | ORAL | 5 refills | Status: DC
Start: 2018-05-02 — End: 2019-06-19

## 2018-05-02 NOTE — Telephone Encounter (Signed)
[employer] [id number].  Approved through 05/03/2019.  PR#[id number].

## 2018-05-02 NOTE — Telephone Encounter (Signed)
PA for modafinil initiated through covermymeds.  Coverage with [employer] [phone]).  Pt FP#[id number].  Decision pending.

## 2018-05-02 NOTE — Telephone Encounter (Signed)
Pt's 4th ocrevus infusion is 9/11, she is wanting to get a 30 day supply of modanifil sent to [facility]. She is going to [place] to stay with son and daughter-in-law after the birth of their baby on 8/30. Pt said modanifil gave her energy. Please call to advise

## 2018-05-02 NOTE — Telephone Encounter (Signed)
Spoke to Reya - states modafinil 200mg  daily was really helpful in the past with her daytime fatigue and she would like to restart it.  Ok, per vo by Dr. Krista Blue, to provide her with this prescription.  The patient is aware it will be sent to the pharmacy for her.

## 2018-05-04 ENCOUNTER — Telehealth: Payer: Self-pay | Admitting: *Deleted

## 2018-05-04 ENCOUNTER — Other Ambulatory Visit: Payer: Self-pay | Admitting: Neurology

## 2018-05-04 NOTE — Telephone Encounter (Signed)
Received pharmacy request for Xanax refill.  They are unable to find the recent prescription on file.  New rx printed, signed and faxed to [place] at [phone].  They have been instructed to void all other Xanax refills other than this current prescription.  No issues on [place] narcotic registry.

## 2018-05-24 ENCOUNTER — Ambulatory Visit
Admission: RE | Admit: 2018-05-24 | Discharge: 2018-05-24 | Disposition: A | Discharge status: Home / Self Care | Payer: 59 | Source: Ambulatory Visit | Attending: Neurology | Admitting: Neurology

## 2018-05-24 DIAGNOSIS — G35 Multiple sclerosis: Secondary | ICD-10-CM | POA: Diagnosis not present

## 2018-05-24 MED ORDER — GADOBENATE DIMEGLUMINE 529 MG/ML IV SOLN
20.0000 mL | Freq: Once | INTRAVENOUS | Status: AC | PRN
  Administered 2018-05-24: 17 mL via INTRAVENOUS

## 2018-07-14 ENCOUNTER — Ambulatory Visit: Payer: 59 | Admitting: Neurology

## 2018-07-14 ENCOUNTER — Encounter: Payer: Self-pay | Admitting: Neurology

## 2018-07-14 VITALS — BP 124/80 | HR 66 | Ht 63.5 in | Wt 188.2 lb

## 2018-07-14 DIAGNOSIS — F32A Depression, unspecified: Secondary | ICD-10-CM

## 2018-07-14 DIAGNOSIS — R5383 Other fatigue: Secondary | ICD-10-CM

## 2018-07-14 DIAGNOSIS — F329 Major depressive disorder, single episode, unspecified: Secondary | ICD-10-CM

## 2018-07-14 DIAGNOSIS — G35 Multiple sclerosis: Secondary | ICD-10-CM | POA: Diagnosis not present

## 2018-07-14 NOTE — Progress Notes (Signed)
Chief Complaint  Patient presents with  . Multiple Sclerosis    She has continued Ocrevus.  No new concerns today.  She would like to review her recent brain MRI.      [facility]  PATIENT: Suzanne Robbins DOB: 1970/12/02  HISTORY OF PRESENT ILLNESS: Suzanne Robbins, 47 -year-old female with history of relapsing remitting multiple sclerosis   In August 2013, she noticed numbness of her left foot, intermittent, faded away in 2 weeks  In September 2013, she noticed left arm numbness, itching underneath her skin, lasting for 2 weeks  In October 2013, she began to notice unbalanced gait, dizziness, progressively worse, woke up one morning noticed double vision, worsening balance difficulty, binocular diplopia, spinning sensation, profound gait difficulty, fatigue, nausea   She was admitted to [facility], July 13 2012 .  MRI brain ([place]) showed numerous white matter lesions which are predominately periventricular location suspicious for chronic Suzanne, with suspected acute demyelinating lesion in the right temporal lobe periventricular optic radiation. Mild premature atrophy. No contrast was given. MRA brain was normal.  [facility], Jul 27, 2012 MRI of the brain showed Multiple chronic periventricular and subcortical chronic demyelinating plaques.There are 3 enhancing lesions likely acute demyelinating plaques in the right periatrial, left posterior frontal and left occipital regions  MRI cervical spine showed Multiple chronic demyelinating plaques at C2-3, C3, C4, C5-6, and C7. No contrast enhancement   Laboratory evaluation showed normal ESR 10, CMP, CBC, negative Lyme titer, acetylcholine receptor antibody, ANA, TSH,   CSF, WBC 3, RBC 12, negative cryptococcal antigen, 5 oligoclonal bands, total protein 29, glucose 65  VEP 07/25/2012: was within normal limits bilaterally. No evidence of conduction slowing was seen within the anterior visual pathways  Tysabri since December  2013.  JC virus antibody was negative in Jul 19, 2102. antibody was intermediate on March 2015. 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 No enhancing lesions are noted. The presence of several T1 black holes and mild generalized atrophy indicates chronic disease. Compared with previous MRI scan dated 07/13/2012 there appears to be minimum progression of disease activity  She was treated with provigil 100 mg twice a day in 2016, which has helped her fatigue,  Repeat MRI of the brain with without contrast in September 2016 Numerous round and ovoid periventricular, subcortical and peri-callosal chronic demyelinating plaques. Several of these are hypointense on T1 views. No abnormal lesions are seen on post contrast views.  Compared to MRI on 07/13/12, there are several (at least 4 left hemisphere) new chronic plaques noted in the current study, and not clearly seen in the prior study.--No clinical flareup reported  UPDATE Sept 28th 2017: She now has a new primary care at [facility] Dr. Octavio Graves, we reviewed previous laboratory evaluation in March 2017, continue sliding trending up of abnormal AST 88, ALT 115,  JC virus was negative with titer of 0.19 on December 04 2015,  We have personally reviewed repeat MRI of the brain with and without contrast in September 2017, no change in comparison with 2016, She is concerned about high co-pay with [name] infusion, wants to consider other treatment options, including by mouth forms  Update March 22 2017: She was enrolled into research since November 2017, began to receive treatment  since December 2017, in January, she had a flareup of worsening gait abnormality, repeat MRI of the brain with and without contrast on September 30 2016 showed multiple hyperintensity foci, 6 of foci enhancement, which was not present on previous MRIs on August 13 2016,  She was treated with IV steroid 4 day in Jan 2018, with great recovery  Since June 2018, she noticed gradually worsening fatigue, gait abnormality, left leg weakness, right eye pain, color washing out, which has been persistent since then,  Today she has to walk with a single foot cane up to 300 m, significant fatigue  UPDATE April 11 2018: She is getting ocrelizumab since October 2018, every 6 months, had repeat infusion in March, and then September 2019.  He went on social disability since December 2017, she lives with her daughter, who has suffered postpartum depression, she is helping her with grandson.  She is doing very well with her ocrelizumab, no Suzanne flareup, taking Zoloft 150 mg every day, Xanax as needed  We have personally reviewed MRI of brain with and without contrast in July 2018, there is multiple T2/flair consistent with chronic multiple sclerosis, compared to MRI in Jan 2018, there are 4 new foci that have developed.  UPDATE Jul 14 2018: We have personally reviewed MRI of the brain with without contrast on May 25, 2018 in comparison to previous MRI in July 2018, there was no significant change noted.  She continue complains of fatigue, taking Provigil to 500 mg daily, Zoloft 50 mg 3 tablets every day helps her depression,   REVIEW OF SYSTEMS: Full 14 system review of systems performed and notable only for those listed, all others are neg: as above.  ALLERGIES: No Known Allergies  HOME MEDICATIONS: Outpatient Medications Prior to Visit  Medication Sig Dispense Refill  . ALPRAZolam (XANAX) 0.5 MG tablet TAKE 1 TABLET BY MOUTH THREE TIMES DAILY AS NEEDED FOR ANXIETY 90 tablet 0  . atorvastatin (LIPITOR) 20 MG tablet atorvastatin 20 mg tablet  TAKE 1 TABLET BY MOUTH EVERY DAY    . estradiol (ESTRACE) 2 MG tablet Take 2 mg by mouth daily.    Marland Kitchen  ibuprofen (ADVIL,MOTRIN) 200 MG tablet Take 400 mg by mouth every 6 (six) hours as needed. For pain    . modafinil (PROVIGIL) 200 MG tablet Take 1 tablet (200 mg total) by mouth daily. 30 tablet 5  . ocrelizumab 600 mg in sodium chloride 0.9 % 500 mL Inject 600 mg every 6 (six) months into the vein.    Marland Kitchen oxyCODONE-acetaminophen (PERCOCET/ROXICET) 5-325 MG tablet Take 1 tablet by mouth 2 (two) times daily as needed for severe pain. 60 tablet 0  . sertraline (ZOLOFT) 50 MG tablet Take 3 tablets (150 mg total) by mouth daily. 270 tablet 4  . ranitidine (ZANTAC 75) 75 MG tablet Take 1 tablet (75 mg total) by mouth 2 (two) times daily. 60 tablet 6   No facility-administered medications prior to visit.     PAST MEDICAL HISTORY: Past Medical History:  Diagnosis Date  . Suzanne (multiple sclerosis) ([place]) 07/13/12  . Uterine cancer ([place]) 2000    PAST SURGICAL HISTORY: Past Surgical History:  Procedure Laterality Date  . ABDOMINAL HYSTERECTOMY  2000    FAMILY HISTORY: Family History  Problem Relation Age of Onset  . Lung cancer Father     SOCIAL HISTORY: Social History   Socioeconomic History  . Marital status: Legally Separated    Spouse name: Not on file  . Number  of children: 2  . Years of education: 32  . Highest education level: Not on file  Occupational History  . Occupation: Administrator, sports: [employer]  . Financial resource strain: Not on file  . Food insecurity:    Worry: Not on file    Inability: Not on file  . Transportation needs:    Medical: Not on file    Non-medical: Not on file  Tobacco Use  . Smoking status: Never Smoker  . Smokeless tobacco: Never Used  Substance and Sexual Activity  . Alcohol use: No  . Drug use: No  . Sexual activity: Not on file  Lifestyle  . Physical activity:    Days per week: Not on file    Minutes per session: Not on file  . Stress: Not on file  Relationships  . Social connections:    Talks on phone: Not on file     Gets together: Not on file    Attends religious service: Not on file    Active member of club or organization: Not on file    Attends meetings of clubs or organizations: Not on file    Relationship status: Not on file  . Intimate partner violence:    Fear of current or ex partner: Not on file    Emotionally abused: Not on file    Physically abused: Not on file    Forced sexual activity: Not on file  Other Topics Concern  . Not on file  Social History Narrative   Patient lives at home with her daughter. Patient has a two years of college education.   Right handed.   Caffeine- two sodas daily.   Patient is separated.      PHYSICAL EXAM  PHYSICAL EXAMNIATION:  Gen: NAD, conversant, well nourised, obese, well groomed                     Cardiovascular: Regular rate rhythm, no peripheral edema, warm, nontender. Eyes: Conjunctivae clear without exudates or hemorrhage Neck: Supple, no carotid bruise. Pulmonary: Clear to auscultation bilaterally   NEUROLOGICAL EXAM:  MENTAL STATUS: Speech:    Speech is normal; fluent and spontaneous with normal comprehension.  Cognition:    The patient is oriented to person, place, and time;     recent and remote memory intact;     language fluent;     normal attention, concentration,     fund of knowledge.  CRANIAL NERVES: CN II: Visual fields are full to confrontation. Fundoscopic exam is normal with sharp discs and no vascular changes. pupils are 4 mm and briskly reactive to light. Visual acuity is 20/20 bilaterally. CN III, IV, VI: extraocular movement are normal. No ptosis. CN V: Facial sensation is intact to pinprick in all 3 divisions bilaterally. Corneal responses are intact.  CN VII: Face is symmetric with normal eye closure and smile. CN VIII: Hearing is normal to rubbing fingers CN IX, X: Palate elevates symmetrically. Phonation is normal. CN XI: Head turning and shoulder shrug are intact CN XII: Tongue is midline with normal  movements and no atrophy.  MOTOR: No significant muscle weakness noted.  REFLEXES: Reflexes are 3 and symmetric at the biceps, triceps, knees, and ankles. Plantar responses are flexor.  SENSORY: Decreased vibratory sensation, especially at the left lower extremity, decreased sharp and dull discrimination on the left side  COORDINATION: Rapid alternating movements and fine finger movements are intact. There is no dysmetria on finger-to-nose and heel-knee-shin.  GAIT/STANCE: Wide-based, unsteady gait, difficulty performing tandem walking, she is able to walk 100 m by less than 200 m without assistance, rely on 1 foot cane for prolonged walking  DIAGNOSTIC DATA (LABS, IMAGING, TESTING)  Tysabri since December 2013.  JC virus antibody was negative in Jul 19, 2102. antibody was intermediate on March 2015. Negative Sep 2015, negative August 23 2014, negative with titer of 0.19 on December 04 2015,  MRI brain w/wo in Sep 2016:   Numerous round and ovoid periventricular, subcortical and peri-callosal chronic demyelinating plaques. Several of these are hypointense on T1 views.  No abnormal lesions are seen on post contrast views.   Compared to MRI on 07/13/12, there are several (at least 4 left hemisphere) new chronic plaques noted in the current study, and not clearly seen in the prior study.  MRI brain w/wo August 2017: Multiple round and ovoid periventricular and subcortical chronic demyelinating plaques. Some of these are hypointense on T1 views.  No acute plaques. 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 Over all no change compared to MRI from 05/15/15.   MRI cervical w/wo Oct 2014: Left paracentral disc protrusion at C5-6 but without significant compression. Ill-defined spinal cord hyperintensities at C3-4 as well as C5 and C7 may represent remote age demyelinating plaques.  No enhancing lesions are  noted.  ASSESSMENT AND PLAN  47 y.o. year  old female  Relapsing remitting multiple sclerosis, high risk medication management  Flareup in January 2018, again in June 2018, worsening gait difficulty, left lower extremity weakness,   Started ocrelizumab since October 2018, doing very well  Laboratory evaluations  Repeat MRI of the brain with without contrast in September 2019 showed no significant,  Depression, anxiety  Continue Zoloft 50 mg 3 tabs every day  Xanax 0.70m tid as needed.  Fatigue:  Refill provigil 2036mdaily    YiMarcial PacasM.D. Ph.D.  [facility] [id number]: [phone]:      [phone]: Primary Care physician Dr. CyOctavio GravesDO

## 2018-07-15 LAB — CBC WITH DIFFERENTIAL/PLATELET
BASOS ABS: 0.1 10*3/uL (ref 0.0–0.2)
BASOS: 1 %
EOS (ABSOLUTE): 0.1 10*3/uL (ref 0.0–0.4)
Eos: 1 %
Hematocrit: 37 % (ref 34.0–46.6)
Hemoglobin: 12 g/dL (ref 11.1–15.9)
IMMATURE GRANULOCYTES: 0 %
Immature Grans (Abs): 0 10*3/uL (ref 0.0–0.1)
Lymphocytes Absolute: 1.3 10*3/uL (ref 0.7–3.1)
Lymphs: 14 %
MCH: 29.3 pg (ref 26.6–33.0)
MCHC: 32.4 g/dL (ref 31.5–35.7)
MCV: 90 fL (ref 79–97)
MONOS ABS: 0.9 10*3/uL (ref 0.1–0.9)
Monocytes: 10 %
Neutrophils Absolute: 6.8 10*3/uL (ref 1.4–7.0)
Neutrophils: 74 %
PLATELETS: 269 10*3/uL (ref 150–450)
RBC: 4.1 x10E6/uL (ref 3.77–5.28)
RDW: 12.7 % (ref 12.3–15.4)
WBC: 9.2 10*3/uL (ref 3.4–10.8)

## 2018-07-15 LAB — COMPREHENSIVE METABOLIC PANEL
A/G RATIO: 2.2 (ref 1.2–2.2)
ALK PHOS: 95 IU/L (ref 39–117)
ALT: 19 IU/L (ref 0–32)
AST: 20 IU/L (ref 0–40)
Albumin: 4.3 g/dL (ref 3.5–5.5)
BILIRUBIN TOTAL: 0.4 mg/dL (ref 0.0–1.2)
BUN/Creatinine Ratio: 18 (ref 9–23)
BUN: 17 mg/dL (ref 6–24)
CALCIUM: 9.4 mg/dL (ref 8.7–10.2)
CHLORIDE: 104 mmol/L (ref 96–106)
CO2: 23 mmol/L (ref 20–29)
Creatinine, Ser: 0.94 mg/dL (ref 0.57–1.00)
GFR calc Af Amer: 83 mL/min/{1.73_m2} (ref >59)
GFR, EST NON AFRICAN AMERICAN: 72 mL/min/{1.73_m2} (ref >59)
GLOBULIN, TOTAL: 2 g/dL (ref 1.5–4.5)
Glucose: 84 mg/dL (ref 65–99)
POTASSIUM: 4.8 mmol/L (ref 3.5–5.2)
SODIUM: 140 mmol/L (ref 134–144)
Total Protein: 6.3 g/dL (ref 6.0–8.5)

## 2018-07-15 LAB — TSH: TSH: 2.17 u[IU]/mL (ref 0.450–4.500)

## 2018-08-15 ENCOUNTER — Telehealth: Payer: Self-pay | Admitting: Neurology

## 2018-08-15 MED ORDER — METHYLPREDNISOLONE 4 MG PO TBPK: ORAL_TABLET | ORAL | 0 refills | Status: DC

## 2018-08-15 NOTE — Telephone Encounter (Signed)
I called in medrol pack, make sure she does not have signs of infection, fever, UTI, URI

## 2018-08-15 NOTE — Addendum Note (Signed)
Addended by: Marcial Pacas on: 08/15/2018 04:56 PM   Modules accepted: Orders

## 2018-08-15 NOTE — Telephone Encounter (Signed)
Rn call patient about her symptoms she is having. Pt stated the symptoms started yesterday 08/14/2018 on Sunday. Pt stated her whole body is itching all over in her muscles. Also she stated that her gait is unsteady,and she is off balance. Pt stated she has had this issue before when having a flare up. Rn stated a message will be sent to Dr. Krista Blue. The pt verbalized understanding.

## 2018-08-15 NOTE — Telephone Encounter (Signed)
Pt has called asking RN to call her to discuss her feeling that she my be having a flare up.  Please call

## 2018-08-15 NOTE — Telephone Encounter (Signed)
  From: Rosemae J Glaza  Sent: 08/14/2018  6:46 PM EST  To: [facility] Clinical Pool  Subject: Non-Urgent Medical Question             Hi Dr. Krista Blue,  I believe I may be having the first systems of a flare-up. I started having extreme itching last night deep in my muscles and I started to feel off balance. I remember I had those same issues happen on my last relapse so I want to catch it before it gets any worse. Are you able to perscribe me the steroids so I want have to have any medicine through an IV if I head it off before it gets bad. I have been blessed that I haven't had a flare up in a couple of years so the Rodney Booze is doing a great job. I have learned to pay attention to the signs and I know something is starting to happen. I would just needs to have the steroids for the flare-up and then ambien for sleep since the medication makes it difficult to sleep. I wanted to check with you first before I have to go through making an appointment and coming all the way to [facility]. I get my perscriptions filled at [facility] in [place], [place]. Please let me know if this is possible. I just had my MRI in September. Thank you   Please call patient to clarify her MS symptoms. If needed, may give her a follow up appointment.

## 2018-08-15 NOTE — Telephone Encounter (Signed)
Rn call pt about a medrol pack that was sent in by Dr.Yan. PT stated she did not have a UTI, upper respiratory infection, or fever. PT verbalized understanding.

## 2018-10-18 ENCOUNTER — Other Ambulatory Visit: Payer: Self-pay | Admitting: Neurology

## 2018-11-14 ENCOUNTER — Other Ambulatory Visit: Payer: Self-pay

## 2018-11-15 MED ORDER — ALPRAZOLAM 0.5 MG PO TABS: 0.5000 mg | ORAL_TABLET | Freq: Three times a day (TID) | ORAL | 0 refills | Status: DC

## 2018-11-15 NOTE — Telephone Encounter (Signed)
[place] Database Verified LR: 10-22-2018 Qty: 90 Pending appointment: 5-7-2020Butler Denmark, NP)

## 2018-12-21 ENCOUNTER — Other Ambulatory Visit: Payer: Self-pay | Admitting: Neurology

## 2018-12-21 NOTE — Telephone Encounter (Signed)
Do you want to continue filling this medication for this patient?

## 2018-12-22 ENCOUNTER — Telehealth: Payer: Self-pay | Admitting: Neurology

## 2018-12-22 NOTE — Telephone Encounter (Signed)
I usually do not prescribe Xanax regularly, however, there was a one-time prescription on November 15, 2018, I would not want to continue to refill her Xanax, please look more into this, if she needs more help for anxiety, she should contact her primary care doctor

## 2018-12-22 NOTE — Telephone Encounter (Signed)
I was unable to reach the patient by phone.  I left a detailed message (ok per DPR) letting her know it would be best to contact her PCP for further discussion of anxiety treatment.  I provided our number to call back with any questions or concerns.

## 2019-01-09 ENCOUNTER — Telehealth: Payer: Self-pay | Admitting: Neurology

## 2019-01-09 NOTE — Telephone Encounter (Signed)
Pt called in and stated she wants to cancel her appt that is scheduled for 01/12/2019, offered VV and also a Tele Visit pt stated she didn't want either and she will call back to r/s

## 2019-01-09 NOTE — Telephone Encounter (Signed)
Noted! Thank you

## 2019-01-12 ENCOUNTER — Ambulatory Visit: Payer: 59 | Admitting: Neurology

## 2019-06-19 ENCOUNTER — Other Ambulatory Visit: Payer: Self-pay

## 2019-06-19 ENCOUNTER — Encounter: Payer: Self-pay | Admitting: Neurology

## 2019-06-19 ENCOUNTER — Ambulatory Visit (INDEPENDENT_AMBULATORY_CARE_PROVIDER_SITE_OTHER): Payer: 59 | Admitting: Neurology

## 2019-06-19 VITALS — BP 128/76 | HR 75 | Temp 97.4°F | Ht 63.5 in | Wt 217.0 lb

## 2019-06-19 DIAGNOSIS — G35 Multiple sclerosis: Secondary | ICD-10-CM

## 2019-06-19 MED ORDER — SERTRALINE HCL 50 MG PO TABS: 150.0000 mg | ORAL_TABLET | Freq: Every day | ORAL | 4 refills | Status: DC

## 2019-06-19 MED ORDER — MODAFINIL 200 MG PO TABS: 200.0000 mg | ORAL_TABLET | Freq: Every day | ORAL | 5 refills | Status: DC

## 2019-06-19 NOTE — Progress Notes (Signed)
Chief Complaint  Patient presents with  . Multiple Sclerosis    She is here for her six month follow up.  She has no new concerns today.  Feels she is doing well on Ocrevus.       [facility]  PATIENT: Suzanne Robbins DOB: Apr 04, 1971  HISTORY OF PRESENT ILLNESS: Suzanne Robbins, 48 -year-old female with history of relapsing remitting multiple sclerosis   In August 2013, she noticed numbness of her left foot, intermittent, faded away in 2 weeks  In September 2013, she noticed left arm numbness, itching underneath her skin, lasting for 2 weeks  In October 2013, she began to notice unbalanced gait, dizziness, progressively worse, woke up one morning noticed double vision, worsening balance difficulty, binocular diplopia, spinning sensation, profound gait difficulty, fatigue, nausea   She was admitted to [facility], July 13 2012 .  MRI brain ([place]) showed numerous white matter lesions which are predominately periventricular location suspicious for chronic Suzanne, with suspected acute demyelinating lesion in the right temporal lobe periventricular optic radiation. Mild premature atrophy. No contrast was given. MRA brain was normal.  [facility], Jul 27, 2012 MRI of the brain showed Multiple chronic periventricular and subcortical chronic demyelinating plaques.There are 3 enhancing lesions likely acute demyelinating plaques in the right periatrial, left posterior frontal and left occipital regions  MRI cervical spine showed Multiple chronic demyelinating plaques at C2-3, C3, C4, C5-6, and C7. No contrast enhancement   Laboratory evaluation showed normal ESR 10, CMP, CBC, negative Lyme titer, acetylcholine receptor antibody, ANA, TSH,   CSF, WBC 3, RBC 12, negative cryptococcal antigen, 5 oligoclonal bands, total protein 29, glucose 65  VEP 07/25/2012: was within normal limits bilaterally. No evidence of conduction slowing was seen within the anterior visual pathways  Tysabri  since December 2013.  JC virus antibody was negative in Jul 19, 2102. antibody was intermediate on March 2015. 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 No enhancing lesions are noted. The presence of several T1 black holes and mild generalized atrophy indicates chronic disease. Compared with previous MRI scan dated 07/13/2012 there appears to be minimum progression of disease activity  She was treated with provigil 100 mg twice a day in 2016, which has helped her fatigue,  Repeat MRI of the brain with without contrast in September 2016 Numerous round and ovoid periventricular, subcortical and peri-callosal chronic demyelinating plaques. Several of these are hypointense on T1 views. No abnormal lesions are seen on post contrast views.  Compared to MRI on 07/13/12, there are several (at least 4 left hemisphere) new chronic plaques noted in the current study, and not clearly seen in the prior study.--No clinical flareup reported  UPDATE Sept 28th 2017: She now has a new primary care at [facility] Dr. Octavio Graves, we reviewed previous laboratory evaluation in March 2017, continue sliding trending up of abnormal AST 88, ALT 115,  JC virus was negative with titer of 0.19 on December 04 2015,  We have personally reviewed repeat MRI of the brain with and without contrast in September 2017, no change in comparison with 2016, She is concerned about high co-pay with [name] infusion, wants to consider other treatment options, including by mouth forms  Update March 22 2017: She was enrolled into research since  November 2017, began to receive treatment since December 2017, in January, she had a flareup of worsening gait abnormality, repeat MRI of the brain with and without  contrast on September 30 2016 showed multiple hyperintensity foci, 6 of foci enhancement, which was not present on previous MRIs on August 13 2016,  She was treated with IV steroid 4 day in Jan 2018, with great recovery  Since June 2018, she noticed gradually worsening fatigue, gait abnormality, left leg weakness, right eye pain, color washing out, which has been persistent since then,  Today she has to walk with a single foot cane up to 300 m, significant fatigue  UPDATE April 11 2018: She is getting ocrelizumab since October 2018, every 6 months, had repeat infusion in March, and then September 2019.  He went on social disability since December 2017, she lives with her daughter, who has suffered postpartum depression, she is helping her with grandson.  She is doing very well with her ocrelizumab, no Suzanne flareup, taking Zoloft 150 mg every day, Xanax as needed  We have personally reviewed MRI of brain with and without contrast in July 2018, there is multiple T2/flair consistent with chronic multiple sclerosis, compared to MRI in Jan 2018, there are 4 new foci that have developed.  UPDATE Jul 14 2018: We have personally reviewed MRI of the brain with without contrast on May 25, 2018 in comparison to previous MRI in July 2018, there was no significant change noted.  She continue complains of fatigue, taking Provigil to 500 mg daily, Zoloft 50 mg 3 tablets every day helps her depression.  UPDATE Jun 19 2019: She is overall doing very well, in December 2019, she had an episode of mildly blurry vision, leg weakness, gait abnormality, trouble getting out of bed, improved with steroid package, symptoms last for 2 weeks, now she is at her baseline, receiving ocrelizumab, tolerating it well, still taking Zoloft 50 mg 3 tablets, Provigil as needed for fatigue   REVIEW OF SYSTEMS: Full 14 system review of systems performed and notable only for those listed, all others are neg: as above.   ALLERGIES: No Known Allergies  HOME MEDICATIONS: Outpatient Medications Prior to Visit  Medication Sig Dispense Refill  . estradiol (ESTRACE) 2 MG tablet Take 2 mg by mouth daily.    Marland Kitchen ibuprofen (ADVIL,MOTRIN) 200 MG tablet Take 400 mg by mouth every 6 (six) hours as needed. For pain    . modafinil (PROVIGIL) 200 MG tablet Take 1 tablet (200 mg total) by mouth daily. 30 tablet 5  . ocrelizumab 600 mg in sodium chloride 0.9 % 500 mL Inject 600 mg every 6 (six) months into the vein.    Marland Kitchen sertraline (ZOLOFT) 50 MG tablet Take 3 tablets (150 mg total) by mouth daily. 270 tablet 4  . ALPRAZolam (XANAX) 0.5 MG tablet Take 1 tablet (0.5 mg total) by mouth 3 (three) times daily. 90 tablet 0  . atorvastatin (LIPITOR) 20 MG tablet atorvastatin 20 mg tablet  TAKE 1 TABLET BY MOUTH EVERY DAY    . methylPREDNISolone (MEDROL DOSEPAK) 4 MG TBPK tablet Take as instructed. 21 tablet 0  . oxyCODONE-acetaminophen (PERCOCET/ROXICET) 5-325 MG tablet Take 1 tablet by mouth 2 (two) times daily as needed for severe pain. 60 tablet 0   No facility-administered medications prior to visit.     PAST MEDICAL HISTORY: Past Medical History:  Diagnosis Date  . Suzanne (multiple sclerosis) ([place]) 07/13/12  . Uterine cancer ([place]) 2000    PAST SURGICAL  HISTORY: Past Surgical History:  Procedure Laterality Date  . ABDOMINAL HYSTERECTOMY  2000    FAMILY HISTORY: Family History  Problem Relation Age of Onset  . Lung cancer Father     SOCIAL HISTORY: Social History   Socioeconomic History  . Marital status: Legally Separated    Spouse name: Not on file  . Number of children: 2  . Years of education: 3  . Highest education level: Not on file  Occupational History  . Occupation: Administrator, sports: [employer]  . Financial resource strain: Not on file  . Food insecurity    Worry: Not on file    Inability: Not on file  . Transportation needs    Medical: Not on file    Non-medical: Not on file   Tobacco Use  . Smoking status: Never Smoker  . Smokeless tobacco: Never Used  Substance and Sexual Activity  . Alcohol use: No  . Drug use: No  . Sexual activity: Not on file  Lifestyle  . Physical activity    Days per week: Not on file    Minutes per session: Not on file  . Stress: Not on file  Relationships  . Social Herbalist on phone: Not on file    Gets together: Not on file    Attends religious service: Not on file    Active member of club or organization: Not on file    Attends meetings of clubs or organizations: Not on file    Relationship status: Not on file  . Intimate partner violence    Fear of current or ex partner: Not on file    Emotionally abused: Not on file    Physically abused: Not on file    Forced sexual activity: Not on file  Other Topics Concern  . Not on file  Social History Narrative   Patient lives at home with her daughter. Patient has a two years of college education.   Right handed.   Caffeine- two sodas daily.   Patient is separated.      PHYSICAL EXAM  PHYSICAL EXAMNIATION:  Gen: NAD, conversant, well nourised, well groomed                     Cardiovascular: Regular rate rhythm, no peripheral edema, warm, nontender. Eyes: Conjunctivae clear without exudates or hemorrhage Neck: Supple, no carotid bruise. Pulmonary: Clear to auscultation bilaterally   NEUROLOGICAL EXAM:  MENTAL STATUS: Speech:    Speech is normal; fluent and spontaneous with normal comprehension.  Cognition:    The patient is oriented to person, place, and time;     recent and remote memory intact;     language fluent;     normal attention, concentration,     fund of knowledge.  CRANIAL NERVES: CN II: Visual fields are full to confrontation.  Pupils are 4 mm and briskly reactive to light. Visual acuity is 20/20 bilaterally. CN III, IV, VI: extraocular movement are normal. No ptosis. CN V: Facial sensation is intact to pinprick in all 3 divisions  bilaterally. Corneal responses are intact.  CN VII: Face is symmetric with normal eye closure and smile. CN VIII: Hearing is normal to rubbing fingers CN IX, X: Palate elevates symmetrically. Phonation is normal. CN XI: Head turning and shoulder shrug are intact CN XII: Tongue is midline with normal movements and no atrophy.  MOTOR: No significant muscle weakness noted.  REFLEXES: Reflexes are 3 and symmetric  at the biceps, triceps, knees, and ankles. Plantar responses are flexor.  SENSORY: Decreased vibratory sensation, especially at the left lower extremity, decreased sharp and dull discrimination on the left side  COORDINATION: Rapid alternating movements and fine finger movements are intact. There is no dysmetria on finger-to-nose and heel-knee-shin.   GAIT/STANCE: Wide-based, unsteady gait, difficulty performing tandem walking, she is able to walk 100 m by less than 200 m without assistance, rely on 1 foot cane for prolonged walking  DIAGNOSTIC DATA (LABS, IMAGING, TESTING)  Tysabri since December 2013.  JC virus antibody was negative in Jul 19, 2102. antibody was intermediate on March 2015. Negative Sep 2015, negative August 23 2014, negative with titer of 0.19 on December 04 2015,  MRI brain w/wo in Sep 2016:   Numerous round and ovoid periventricular, subcortical and peri-callosal chronic demyelinating plaques. Several of these are hypointense on T1 views.  No abnormal lesions are seen on post contrast views.   Compared to MRI on 07/13/12, there are several (at least 4 left hemisphere) new chronic plaques noted in the current study, and not clearly seen in the prior study.  MRI brain w/wo August 2017: Multiple round and ovoid periventricular and subcortical chronic demyelinating plaques. Some of these are hypointense on T1 views.  No acute plaques. 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 Over  all no change compared to MRI from 05/15/15.   MRI cervical w/wo Oct 2014: Left paracentral disc protrusion at C5-6 but without significant compression. Ill-defined spinal cord hyperintensities at C3-4 as well as C5 and C7 may represent remote age demyelinating plaques.  No enhancing lesions are noted.  ASSESSMENT AND PLAN  48 y.o. year  old female  Relapsing remitting multiple sclerosis, high risk medication management  Flareup in January 2018, again in June 2018, possible flareup December 2019, worsening gait difficulty, left lower extremity weakness,   Started ocrelizumab since October 2018, doing very well  Repeat MRI of the brain with without contrast in September 2019 showed no significant,  Depression, anxiety  Continue Zoloft 50 mg 3 tabs every day  Fatigue:  Refill provigil 218m daily    YMarcial Pacas M.D. Ph.D.  [facility] [address] [place] [id number]: [phone]:      [phone] CC: Primary Care physician Dr. COctavio Graves DO

## 2019-06-21 ENCOUNTER — Other Ambulatory Visit: Payer: Self-pay

## 2019-06-21 DIAGNOSIS — Z20822 Contact with and (suspected) exposure to covid-19: Secondary | ICD-10-CM

## 2019-06-22 ENCOUNTER — Telehealth: Payer: Self-pay | Admitting: *Deleted

## 2019-06-22 LAB — NOVEL CORONAVIRUS, NAA: SARS-CoV-2, NAA: DETECTED — AB

## 2019-06-22 NOTE — Telephone Encounter (Signed)
Pt called and stated that she missed a call regarding results. Pt would like a call back. Please advise   Cb#[phone]

## 2019-07-08 ENCOUNTER — Other Ambulatory Visit: Payer: Self-pay | Admitting: Neurology

## 2019-12-18 ENCOUNTER — Encounter: Payer: Self-pay | Admitting: Neurology

## 2019-12-18 ENCOUNTER — Telehealth: Payer: Self-pay | Admitting: Neurology

## 2019-12-18 ENCOUNTER — Ambulatory Visit: Payer: Medicare Other | Admitting: Neurology

## 2019-12-18 ENCOUNTER — Other Ambulatory Visit: Payer: Self-pay

## 2019-12-18 VITALS — BP 128/82 | HR 76 | Temp 97.7°F | Ht 63.0 in | Wt 207.0 lb

## 2019-12-18 DIAGNOSIS — F329 Major depressive disorder, single episode, unspecified: Secondary | ICD-10-CM

## 2019-12-18 DIAGNOSIS — R5383 Other fatigue: Secondary | ICD-10-CM

## 2019-12-18 DIAGNOSIS — G35 Multiple sclerosis: Secondary | ICD-10-CM

## 2019-12-18 DIAGNOSIS — F32A Depression, unspecified: Secondary | ICD-10-CM

## 2019-12-18 MED ORDER — BACLOFEN 10 MG PO TABS: 10.0000 mg | ORAL_TABLET | Freq: Every day | ORAL | 5 refills | Status: DC

## 2019-12-18 NOTE — Telephone Encounter (Signed)
Mcarthur Rossetti Josem Kaufmann: [id number] (exp. 12/18/19 to 01/17/20) order sent to GI. They will reach out to the patient to schedule.

## 2019-12-18 NOTE — Patient Instructions (Signed)
It was great to meet you today! Try baclofen at bedtime for muscle spasms Continue Ocrevus  Check blood work today, order MRI of the brain See you back in 6 months

## 2019-12-18 NOTE — Progress Notes (Signed)
PATIENT: Suzanne Robbins DOB: 12-31-1970  REASON FOR VISIT: follow up HISTORY FROM: patient  HISTORY OF PRESENT ILLNESS: Today 12/18/19  HISTORY HISTORY OF PRESENT ILLNESS: Suzanne Robbins, 49 -year-old female with history of relapsing remitting multiple sclerosis   In August 2013, she noticed numbness of her left foot, intermittent, faded away in 2 weeks  In September 2013, she noticed left arm numbness, itching underneath her skin, lasting for 2 weeks  In October 2013, she began to notice unbalanced gait, dizziness, progressively worse, woke up one morning noticed double vision, worsening balance difficulty, binocular diplopia, spinning sensation, profound gait difficulty, fatigue, nausea   She was admitted to [facility], July 13 2012 .  MRI brain ([place]) showed numerous white matter lesions which are predominately periventricular location suspicious for chronic Suzanne, with suspected acute demyelinating lesion in the right temporal lobe periventricular optic radiation. Mild premature atrophy. No contrast was given. MRA brain was normal.  [facility], Jul 27, 2012 MRI of the brain showed Multiple chronic periventricular and subcortical chronic demyelinating plaques.There are 3 enhancing lesions likely acute demyelinating plaques in the right periatrial, left posterior frontal and left occipital regions  MRI cervical spine showed Multiple chronic demyelinating plaques at C2-3, C3, C4, C5-6, and C7. No contrast enhancement   Laboratory evaluation showed normal ESR 10, CMP, CBC, negative Lyme titer, acetylcholine receptor antibody, ANA, TSH,   CSF, WBC 3, RBC 12, negative cryptococcal antigen, 5 oligoclonal bands, total protein 29, glucose 65  VEP 07/25/2012: was within normal limits bilaterally. No evidence of conduction slowing was seen within the anterior visual pathways  Tysabri since December 2013.  JC virus antibody was negative in Jul 19, 2102. antibody was intermediate on  March 2015. 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 No enhancing lesions are noted. The presence of several T1 black holes and mild generalized atrophy indicates chronic disease. Compared with previous MRI scan dated 07/13/2012 there appears to be minimum progression of disease activity  She was treated with provigil 100 mg twice a day in 2016, which has helped her fatigue,  Repeat MRI of the brain with without contrast in September 2016 Numerous round and ovoid periventricular, subcortical and peri-callosal chronic demyelinating plaques. Several of these are hypointense on T1 views. No abnormal lesions are seen on post contrast views.  Compared to MRI on 07/13/12, there are several (at least 4 left hemisphere) new chronic plaques noted in the current study, and not clearly seen in the prior study.--No clinical flareup reported  UPDATE Sept 28th 2017: She now has a new primary care at [facility] Dr. Octavio Graves, we reviewed previous laboratory evaluation in March 2017, continue sliding trending up of abnormal AST 88, ALT 115,  JC virus was negative with titer of 0.19 on December 04 2015,  We have personally reviewed repeat MRI of the brain with and without contrast in September 2017, no change in comparison with 2016, She is concerned about high co-pay with [name] infusion, wants to consider other treatment options, including by mouth forms  Update March 22 2017: She was enrolled into research since November 2017, began to receive treatment since December 2017, in January, she had a flareup of worsening gait abnormality, repeat MRI of the brain with and without contrast  on September 30 2016 showed multiple hyperintensity foci, 6 of foci enhancement, which was  not present on previous MRIs on August 13 2016,  She was treated with IV steroid 4 day in Jan 2018, with great recovery  Since June 2018, she noticed gradually worsening fatigue, gait abnormality, left leg weakness, right eye pain, color washing out, which has been persistent since then,  Today she has to walk with a single foot cane up to 300 m, significant fatigue  UPDATE April 11 2018: She is getting ocrelizumab since October 2018, every 6 months, had repeat infusion in March, and then September 2019.  He went on social disability since December 2017, she lives with her daughter, who has suffered postpartum depression, she is helping her with grandson.  She is doing very well with her ocrelizumab, no Suzanne flareup, taking Zoloft 150 mg every day, Xanax as needed  We have personally reviewed MRI of brain with and without contrast in July 2018, there is multiple T2/flair consistent with chronic multiple sclerosis, compared to MRI in Jan 2018, there are 4 new foci that have developed.  UPDATE Jul 14 2018: We have personally reviewed MRI of the brain with without contrast on May 25, 2018 in comparison to previous MRI in July 2018, there was no significant change noted.  She continue complains of fatigue, taking Provigil to 500 mg daily, Zoloft 50 mg 3 tablets every day helps her depression.  UPDATE Jun 19 2019: She is overall doing very well, in December 2019, she had an episode of mildly blurry vision, leg weakness, gait abnormality, trouble getting out of bed, improved with steroid package, symptoms last for 2 weeks, now she is at her baseline, receiving ocrelizumab, tolerating it well, still taking Zoloft 50 mg 3 tablets, Provigil as needed for fatigue  Update Suzanne Robbins 12, 2021 SS: Is here alone. She has good and days days, some days brain is foggy. Her husband says at night she may jerk at night, it wakes her up, her neck or her legs spasm. Takes Provigil as needed. She has fallen  twice, when walking feels like she gets ahead of herself, both falls have been down the steps. She is on disablity, used to work in Science writer. Lives with husband. Tries to stay active, keeps grandson twice a week, who is 2. With activity she has to pace herself. Next infusion Britanny 28, She had COVID in October 2020.  REVIEW OF SYSTEMS: Out of a complete 14 system review of symptoms, the patient complains only of the following symptoms, and all other reviewed systems are negative.  Fatigue  ALLERGIES: No Known Allergies  HOME MEDICATIONS: Outpatient Medications Prior to Visit  Medication Sig Dispense Refill  . estradiol (ESTRACE) 2 MG tablet Take 2 mg by mouth daily.    Marland Kitchen ibuprofen (ADVIL,MOTRIN) 200 MG tablet Take 400 mg by mouth every 6 (six) hours as needed. For pain    . modafinil (PROVIGIL) 200 MG tablet Take 1 tablet (200 mg total) by mouth daily. 30 tablet 5  . ocrelizumab 600 mg in sodium chloride 0.9 % 500 mL Inject 600 mg every 6 (six) months into the vein.    Marland Kitchen sertraline (ZOLOFT) 50 MG tablet Take 3 tablets (150 mg total) by mouth daily. 270 tablet 4   No facility-administered medications prior to visit.    PAST MEDICAL HISTORY: Past Medical History:  Diagnosis Date  . Suzanne (multiple sclerosis) ([place]) 07/13/12  . Uterine cancer ([place]) 2000    PAST SURGICAL  HISTORY: Past Surgical History:  Procedure Laterality Date  . ABDOMINAL HYSTERECTOMY  2000    FAMILY HISTORY: Family History  Problem Relation Age of Onset  . Lung cancer Father     SOCIAL HISTORY: Social History   Socioeconomic History  . Marital status: Legally Separated    Spouse name: Not on file  . Number of children: 2  . Years of education: 53  . Highest education level: Not on file  Occupational History  . Occupation: Administrator, sports: [employer]  Tobacco Use  . Smoking status: Never Smoker  . Smokeless tobacco: Never Used  Substance and Sexual Activity  . Alcohol use: No  . Drug use: No  .  Sexual activity: Not on file  Other Topics Concern  . Not on file  Social History Narrative   Patient lives at home with her daughter. Patient has a two years of college education.   Right handed.   Caffeine- two sodas daily.   Patient is separated.    Social Determinants of Health   Financial Resource Strain:   . Difficulty of Paying Living Expenses:   Food Insecurity:   . Worried About Charity fundraiser in the Last Year:   . Arboriculturist in the Last Year:   Transportation Needs:   . Film/video editor (Medical):   Marland Kitchen Lack of Transportation (Non-Medical):   Physical Activity:   . Days of Exercise per Week:   . Minutes of Exercise per Session:   Stress:   . Feeling of Stress :   Social Connections:   . Frequency of Communication with Friends and Family:   . Frequency of Social Gatherings with Friends and Family:   . Attends Religious Services:   . Active Member of Clubs or Organizations:   . Attends Archivist Meetings:   Marland Kitchen Marital Status:   Intimate Partner Violence:   . Fear of Current or Ex-Partner:   . Emotionally Abused:   Marland Kitchen Physically Abused:   . Sexually Abused:    PHYSICAL EXAM  Vitals:   12/18/19 0831  BP: 128/82  Pulse: 76  Temp: 97.7 F (36.5 C)  Weight: 207 lb (93.9 kg)  Height: 5' 3"  (1.6 m)   Body mass index is 36.67 kg/m.  Generalized: Well developed, in no acute distress   Neurological examination  Mentation: Alert oriented to time, place, history taking. Follows all commands speech and language fluent Cranial nerve II-XII: Pupils were equal round reactive to light. Extraocular movements were full, visual field were full on confrontational test. Facial sensation and strength were normal.  Head turning and shoulder shrug were normal and symmetric. Motor: Good strength of all extremities, no significant muscle weakness was noted Sensory: Sensory testing is intact to soft touch on all 4 extremities. No evidence of extinction is  noted.  Coordination: Cerebellar testing reveals good finger-nose-finger and heel-to-shin bilaterally.  Gait and station: Wide-based, unsteady,slight limp on the left,  tandem gait slightly unsteady, no assistive device was used Reflexes: Deep tendon reflexes are brisk throughout DIAGNOSTIC DATA (LABS, IMAGING, TESTING) - I reviewed patient records, labs, notes, testing and imaging myself where available.  Lab Results  Component Value Date   WBC 9.2 07/14/2018   HGB 12.0 07/14/2018   HCT 37.0 07/14/2018   MCV 90 07/14/2018   PLT 269 07/14/2018      Component Value Date/Time   NA 140 07/14/2018 1413   K 4.8 07/14/2018 1413   CL 104  07/14/2018 1413   CO2 23 07/14/2018 1413   GLUCOSE 84 07/14/2018 1413   GLUCOSE 116 (H) 07/13/2012 1216   BUN 17 07/14/2018 1413   CREATININE 0.94 07/14/2018 1413   CALCIUM 9.4 07/14/2018 1413   PROT 6.3 07/14/2018 1413   ALBUMIN 4.3 07/14/2018 1413   AST 20 07/14/2018 1413   ALT 19 07/14/2018 1413   ALKPHOS 95 07/14/2018 1413   BILITOT 0.4 07/14/2018 1413   GFRNONAA 72 07/14/2018 1413   GFRAA 83 07/14/2018 1413   No results found for: CHOL, HDL, LDLCALC, LDLDIRECT, TRIG, CHOLHDL No results found for: HGBA1C No results found for: VITAMINB12 Lab Results  Component Value Date   TSH 2.170 07/14/2018   ASSESSMENT AND PLAN 49 y.o. year old female  has a past medical history of Suzanne (multiple sclerosis) ([place]) (07/13/12) and Uterine cancer ([place]) (2000). here with:  1.  Relapsing remitting multiple sclerosis, high risk medication management -Flareup in January 2018, June 2018, possibly December 2019, worsening gait difficulty, left lower extremity weakness -Has been on Ocrevus since October 2018, doing overall well, stable but has 2 falls, next infusion [place] 28th, 2021 -Will add baclofen 10 mg at bedtime for report of nocturnal muscle spasms -Check CBC, CMP, IgA, IgG, IgM -Order MRI of the brain with and without contrast  2.  Depression, anxiety  -Continue Zoloft 50 mg, 3 tablets daily  3.  Fatigue -Continue Provigil 200 mg daily as needed -Follow-up in 6 months or sooner if needed  I spent 30 minutes of face-to-face and non-face-to-face time with patient.  This included previsit chart review, lab review, study review, order entry, electronic health record documentation, patient education.   Butler Denmark, AGNP-C, DNP 12/18/2019, 9:09 AM [facility] [address], [place], [place] [address] [phone]

## 2019-12-19 ENCOUNTER — Telehealth: Payer: Self-pay | Admitting: *Deleted

## 2019-12-19 LAB — CBC WITH DIFFERENTIAL/PLATELET
Basophils Absolute: 0.1 10*3/uL (ref 0.0–0.2)
Basos: 1 %
EOS (ABSOLUTE): 0.1 10*3/uL (ref 0.0–0.4)
Eos: 1 %
Hematocrit: 41.8 % (ref 34.0–46.6)
Hemoglobin: 13.9 g/dL (ref 11.1–15.9)
Immature Grans (Abs): 0.1 10*3/uL (ref 0.0–0.1)
Immature Granulocytes: 1 %
Lymphocytes Absolute: 1.3 10*3/uL (ref 0.7–3.1)
Lymphs: 15 %
MCH: 30.3 pg (ref 26.6–33.0)
MCHC: 33.3 g/dL (ref 31.5–35.7)
MCV: 91 fL (ref 79–97)
Monocytes Absolute: 0.9 10*3/uL (ref 0.1–0.9)
Monocytes: 10 %
Neutrophils Absolute: 6.2 10*3/uL (ref 1.4–7.0)
Neutrophils: 72 %
Platelets: 274 10*3/uL (ref 150–450)
RBC: 4.58 x10E6/uL (ref 3.77–5.28)
RDW: 13.3 % (ref 11.7–15.4)
WBC: 8.6 10*3/uL (ref 3.4–10.8)

## 2019-12-19 LAB — COMPREHENSIVE METABOLIC PANEL
ALT: 10 IU/L (ref 0–32)
AST: 11 IU/L (ref 0–40)
Albumin/Globulin Ratio: 2 (ref 1.2–2.2)
Albumin: 4.2 g/dL (ref 3.8–4.8)
Alkaline Phosphatase: 91 IU/L (ref 39–117)
BUN/Creatinine Ratio: 14 (ref 9–23)
BUN: 15 mg/dL (ref 6–24)
Bilirubin Total: 0.2 mg/dL (ref 0.0–1.2)
CO2: 24 mmol/L (ref 20–29)
Calcium: 9.5 mg/dL (ref 8.7–10.2)
Chloride: 103 mmol/L (ref 96–106)
Creatinine, Ser: 1.06 mg/dL — ABNORMAL HIGH (ref 0.57–1.00)
GFR calc Af Amer: 71 mL/min/{1.73_m2} (ref >59)
GFR calc non Af Amer: 62 mL/min/{1.73_m2} (ref >59)
Globulin, Total: 2.1 g/dL (ref 1.5–4.5)
Glucose: 91 mg/dL (ref 65–99)
Potassium: 4.5 mmol/L (ref 3.5–5.2)
Sodium: 138 mmol/L (ref 134–144)
Total Protein: 6.3 g/dL (ref 6.0–8.5)

## 2019-12-19 LAB — IGG, IGA, IGM
IgA/Immunoglobulin A, Serum: 83 mg/dL — ABNORMAL LOW (ref 87–352)
IgG (Immunoglobin G), Serum: 600 mg/dL (ref 586–1602)
IgM (Immunoglobulin M), Srm: 34 mg/dL (ref 26–217)

## 2019-12-19 NOTE — Telephone Encounter (Signed)
-----   Message from Suzzanne Cloud, NP sent at 12/19/2019  2:08 PM EDT ----- Labs are overall good, unremarkable. Creatinine very slightly increased 1.06. Will remain on current Ocrevus dosing schedule.

## 2019-12-19 NOTE — Telephone Encounter (Signed)
I called pt and relayed that her lab results overall unremarkable.  Creatinine very slightly elevated 1.06, will continue with ocrevus dosing and schedule.

## 2020-01-16 ENCOUNTER — Ambulatory Visit
Admission: RE | Admit: 2020-01-16 | Discharge: 2020-01-16 | Disposition: A | Discharge status: Home / Self Care | Payer: Medicare HMO | Source: Ambulatory Visit | Attending: Neurology | Admitting: Neurology

## 2020-01-16 DIAGNOSIS — G35 Multiple sclerosis: Secondary | ICD-10-CM

## 2020-01-16 MED ORDER — GADOBENATE DIMEGLUMINE 529 MG/ML IV SOLN
20.0000 mL | Freq: Once | INTRAVENOUS | Status: AC | PRN
  Administered 2020-01-16: 20 mL via INTRAVENOUS

## 2020-01-16 NOTE — Progress Notes (Signed)
I have reviewed and agreed above plan. 

## 2020-01-18 ENCOUNTER — Telehealth: Payer: Self-pay

## 2020-01-18 NOTE — Telephone Encounter (Signed)
Attempted to call pt, LVM for MRI results per DPR. Ask pt to call back for questions or concerns.

## 2020-06-18 ENCOUNTER — Ambulatory Visit: Payer: Medicare HMO | Admitting: Neurology

## 2020-06-18 ENCOUNTER — Encounter: Payer: Self-pay | Admitting: Neurology

## 2020-06-18 ENCOUNTER — Other Ambulatory Visit: Payer: Self-pay

## 2020-06-18 VITALS — BP 128/82 | HR 88 | Ht 63.0 in | Wt 212.2 lb

## 2020-06-18 DIAGNOSIS — F32A Depression, unspecified: Secondary | ICD-10-CM | POA: Diagnosis not present

## 2020-06-18 DIAGNOSIS — G35 Multiple sclerosis: Secondary | ICD-10-CM | POA: Diagnosis not present

## 2020-06-18 DIAGNOSIS — R5383 Other fatigue: Secondary | ICD-10-CM | POA: Diagnosis not present

## 2020-06-18 MED ORDER — SERTRALINE HCL 50 MG PO TABS: 150.0000 mg | ORAL_TABLET | Freq: Every day | ORAL | 4 refills | Status: DC

## 2020-06-18 MED ORDER — BACLOFEN 10 MG PO TABS: 10.0000 mg | ORAL_TABLET | Freq: Every day | ORAL | 11 refills | Status: DC

## 2020-06-18 NOTE — Patient Instructions (Addendum)
Continue current medications  Keep appointment for [facility] November 10th Let us know the report the gum biopsy  See you back in 6 months

## 2020-06-18 NOTE — Progress Notes (Signed)
PATIENT: Suzanne Robbins DOB: [birth year]  REASON FOR VISIT: follow up HISTORY FROM: patient  HISTORY OF PRESENT ILLNESS: Today 06/18/20  HISTORY HISTORY OF PRESENT ILLNESS: Suzanne Robbins, 49 -year-old female with history of relapsing remitting multiple sclerosis  In August 2013, she noticed numbness of her left foot, intermittent, faded away in 2 weeks  In September 2013, she noticed left arm numbness, itching underneath her skin, lasting for 2 weeks  In October 2013, she began to notice unbalanced gait, dizziness, progressively worse, woke up one morning noticed double vision, worsening balance difficulty, binocular diplopia, spinning sensation, profound gait difficulty, fatigue, nausea  She was admitted to [facility], July 13 2012 .  MRI brain ([place]) showed numerous white matter lesions which are predominately periventricular location suspicious for chronic Suzanne, with suspected acute demyelinating lesion in the right temporal lobe periventricular optic radiation. Mild premature atrophy. No contrast was given. MRA brain was normal.  [facility], Jul 27, 2012 MRI of the brain showed Multiple chronic periventricular and subcortical chronic demyelinating plaques.There are 3 enhancing lesions likely acute demyelinating plaques in the right periatrial, left posterior frontal and left occipital regions  MRI cervical spine showed Multiple chronic demyelinating plaques at C2-3, C3, C4, C5-6, and C7. No contrast enhancement   Laboratory evaluation showed normal ESR 10, CMP, CBC, negative Lyme titer, acetylcholine receptor antibody, ANA, TSH,   CSF, WBC 3, RBC 12, negative cryptococcal antigen, 5 oligoclonal bands,total protein 29, glucose 65  VEP 07/25/2012: was within normal limits bilaterally. No evidence of conduction slowing was seen within the anterior visual pathways  Tysabri since December 2013.  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 No enhancing lesions are noted. The presence of several T1 black holes and mild generalized atrophy indicates chronic disease. Compared with previous MRI scan dated 07/13/2012 there appears to be minimum progression of disease activity  She was treated with provigil 100 mg twice a day in 2016, which has helped her fatigue,  Repeat MRI of the brain with without contrast in September 2016 Numerous round and ovoid periventricular, subcortical and peri-callosal chronic demyelinating plaques. Several of these are hypointense on T1 views. No abnormal lesions are seen on post contrast views.  Compared to MRI on 07/13/12, there are several (at least 4 left hemisphere) new chronic plaques noted in the current study, and not clearly seen in the prior study.--No clinical flareup reported  UPDATE Sept 28th 2017: She now has a new primary care at [facility] Suzanne Robbins, we reviewed previous laboratory evaluation in March 2017, continue sliding trending up of abnormal AST 88, ALT 115,  JC virus was negative with titer of 0.19 on December 04 2015,  We have personally reviewed repeat MRI of the brain with and without contrast in September 2017, no change in comparison with 2016, She is concerned about high co-pay with Suzanne Robbins IV infusion, wants to consider other treatment options, including by mouth forms  Update March 22 2017: She was enrolled into research since November 2017, began to receive treatment since December 2017, in January, she had a flareup of worsening gait abnormality, repeat MRI of the brain with and without contrast on September 30 2016 showed multiple hyperintensity foci,  6 of foci enhancement, which was  not present on previous MRIs on August 13 2016,  She was treated with IV steroid 4 day in Jan 2018, with great recovery  Since June 2018, she noticed gradually worsening fatigue, gait abnormality, left leg weakness, right eye pain, color washing out, which has been persistent since then,  Today she has to walk with a single foot cane up to 300 m, significant fatigue  UPDATE April 11 2018: She is getting ocrelizumab since October 2018, every 6 months, had repeat infusion in March, and then September 2019. He went on social disability since December 2017, she lives with her daughter, who has suffered postpartum depression, she is helping her with grandson.  She is doing very well with her ocrelizumab, no Suzanne flareup, taking Zoloft 150 mg every day, Xanax as needed  We have personally reviewed MRI of brain with and without contrast in July 2018, there is multiple T2/flair consistent with chronic multiple sclerosis, compared to MRI in Jan 2018, there are 4 new foci that have developed.  UPDATE Jul 14 2018: We have personally reviewed MRI of the brain with without contrast on May 25, 2018 in comparison to previous MRI in July 2018, there was no significant change noted.  She continue complains of fatigue, taking Provigil to 500 mg daily, Zoloft 50 mg 3 tablets every day helps her depression.  UPDATE Jun 19 2019: She is overall doing very well, in December 2019, she had an episode of mildly blurry vision, leg weakness, gait abnormality, trouble getting out of bed, improved with steroid package, symptoms last for 2 weeks, now she is at her baseline, receiving ocrelizumab, tolerating it well, still taking Zoloft 50 mg 3 tablets, Provigil as needed for fatigue  Update Suzanne Robbins 12, 2021 SS: Is here alone. She has good and days days, some days brain is foggy. Her husband says at night she may jerk at night, it wakes her up, her neck or her legs spasm. Takes Provigil as needed. She has fallen  twice, when walking feels like she gets ahead of herself, both falls have been down the steps. She is on disablity, used to work in Science writer. Lives with husband. Tries to stay active, keeps grandson twice a week, who is 2. With activity she has to pace herself. Next infusion Else 28, She had COVID in October 2020.  Update June 18, 2020 SS: In May 2021, MRI of the brain was overall stable, compared to previous in September 2019.  Remains on Ocrevus, next infusion November 10.  Pending gum biopsy, for 2 abnormal tissue sites.  Waiting on results of mammogram, had a spot to her right forearm removed for abnormal cells.  Her gait is stable, gets going too fast, lost balance, 2 falls.  Vision is doing well, got new glasses.  Occasional electric shock pains to her toes or fingers, sporadic, only a few seconds. B & B doing well, no recent urinary incontinence.  Takes Provigil rarely if she needs to focus, started on cholesterol medication atorvastatin, baclofen really helpful for nocturnal leg cramps, but ran out of prescription.  Keeps her grandson, who is almost 3, 2 days a week, is a handful. 1 month before infusion, feels her right ear fullness.  On Zoloft, well controlled anxiety and depression.  PCP just a full lab work-up, looked good.  IgG, IgA, IgM within range for every 75-monthinfusions.  Has not gotten Covid vaccine.  REVIEW OF SYSTEMS: Out of a complete 14 system  review of symptoms, the patient complains only of the following symptoms, and all other reviewed systems are negative.  See HPI  ALLERGIES: No Known Allergies  HOME MEDICATIONS: Outpatient Medications Prior to Visit  Medication Sig Dispense Refill   atorvastatin (LIPITOR) 20 MG tablet 20 mg.     estradiol (ESTRACE) 2 MG tablet Take 2 mg by mouth daily.     ibuprofen (ADVIL,MOTRIN) 200 MG tablet Take 400 mg by mouth every 6 (six) hours as needed. For pain     modafinil (PROVIGIL) 200 MG tablet Take 1 tablet (200 mg total) by  mouth daily. 30 tablet 5   ocrelizumab (OCREVUS) 300 MG/10ML injection Ocrevus 30 mg/mL intravenous solution  Inject by intravenous route.     ocrelizumab 600 mg in sodium chloride 0.9 % 500 mL Inject 600 mg every 6 (six) months into the vein.     baclofen (LIORESAL) 10 MG tablet Take 1 tablet (10 mg total) by mouth at bedtime. 30 each 5   sertraline (ZOLOFT) 50 MG tablet Take 3 tablets (150 mg total) by mouth daily. 270 tablet 4   No facility-administered medications prior to visit.    PAST MEDICAL HISTORY: Past Medical History:  Diagnosis Date   Suzanne (multiple sclerosis) ([place]) 07/13/12   Uterine cancer ([place]) 2000    PAST SURGICAL HISTORY: Past Surgical History:  Procedure Laterality Date   ABDOMINAL HYSTERECTOMY  2000    FAMILY HISTORY: Family History  Problem Relation Age of Onset   Lung cancer Father     SOCIAL HISTORY: Social History   Socioeconomic History   Marital status: Married    Spouse name: Not on file   Number of children: 2   Years of education: 14   Highest education level: Not on file  Occupational History   Occupation: Administrator, sports: [employer]  Tobacco Use   Smoking status: Never Smoker   Smokeless tobacco: Never Used  Substance and Sexual Activity   Alcohol use: No   Drug use: No   Sexual activity: Not on file  Other Topics Concern   Not on file  Social History Narrative   Patient lives at home with her daughter. Patient has a two years of college education.   Right handed.   Caffeine- two sodas daily.   Patient is separated.    Social Determinants of Health   Financial Resource Strain:    Difficulty of Paying Living Expenses: Not on file  Food Insecurity:    Worried About Charity fundraiser in the Last Year: Not on file   [employer] of Food in the Last Year: Not on file  Transportation Needs:    Lack of Transportation (Medical): Not on file   Lack of Transportation (Non-Medical): Not on file  Physical  Activity:    Days of Exercise per Week: Not on file   Minutes of Exercise per Session: Not on file  Stress:    Feeling of Stress : Not on file  Social Connections:    Frequency of Communication with Friends and Family: Not on file   Frequency of Social Gatherings with Friends and Family: Not on file   Attends Religious Services: Not on file   Active Member of Clubs or Organizations: Not on file   Attends Archivist Meetings: Not on file   Marital Status: Not on file  Intimate Partner Violence:    Fear of Current or Ex-Partner: Not on file   Emotionally Abused: Not on file  Physically Abused: Not on file   Sexually Abused: Not on file   PHYSICAL EXAM  Vitals:   06/18/20 0944  BP: 128/82  Pulse: 88  Weight: 212 lb 3.2 oz (96.3 kg)  Height: 5' 3"  (1.6 m)   Body mass index is 37.59 kg/m.  Generalized: Well developed, in no acute distress   Neurological examination  Mentation: Alert oriented to time, place, history taking. Follows all commands speech and language fluent Cranial nerve II-XII: Pupils were equal round reactive to light. Extraocular movements were full, visual field were full on confrontational test. Facial sensation and strength were normal.  Head turning and shoulder shrug  were normal and symmetric. Motor: Good strength extremities, no significant weakness noted Sensory: Sensory testing is intact to soft touch on all 4 extremities. No evidence of extinction is noted.  Coordination: Cerebellar testing reveals good finger-nose-finger and heel-to-shin bilaterally.  Gait and station: Slightly wide-based, slight limp on the left, tandem gait unsteady, Romberg unsteady, tendency to lean to the right Reflexes: Deep tendon reflexes are brisk throughout  DIAGNOSTIC DATA (LABS, IMAGING, TESTING) - I reviewed patient records, labs, notes, testing and imaging myself where available.  Lab Results  Component Value Date   WBC 8.6 12/18/2019   HGB  13.9 12/18/2019   HCT 41.8 12/18/2019   MCV 91 12/18/2019   PLT 274 12/18/2019      Component Value Date/Time   NA 138 12/18/2019 0922   K 4.5 12/18/2019 0922   CL 103 12/18/2019 0922   CO2 24 12/18/2019 0922   GLUCOSE 91 12/18/2019 0922   GLUCOSE 116 (H) 07/13/2012 1216   BUN 15 12/18/2019 0922   CREATININE 1.06 (H) 12/18/2019 0922   CALCIUM 9.5 12/18/2019 0922   PROT 6.3 12/18/2019 0922   ALBUMIN 4.2 12/18/2019 0922   AST 11 12/18/2019 0922   ALT 10 12/18/2019 0922   ALKPHOS 91 12/18/2019 0922   BILITOT 0.2 12/18/2019 0922   GFRNONAA 62 12/18/2019 0922   GFRAA 71 12/18/2019 0922   No results found for: CHOL, HDL, LDLCALC, LDLDIRECT, TRIG, CHOLHDL No results found for: HGBA1C No results found for: VITAMINB12 Lab Results  Component Value Date   TSH 2.170 07/14/2018   ASSESSMENT AND PLAN 49 y.o. year old female  has a past medical history of Suzanne (multiple sclerosis) ([place]) (07/13/12) and Uterine cancer ([place]) (2000). here with:  1.  Relapsing remitting multiple sclerosis -Flareup in January 2018, June 2018, possibly December 2019, worsening gait difficulty, left lower extremity weakness -On Ocrevus since October 2018, next infusion November 10th -Continue baclofen 10 mg at bedtime for nocturnal muscle spasms -IgM, IgA, IgG was within good range -MRI of the brain was overall stable, compared to September 2019 -Send my chart message with results of gum biopsy -Recommended Covid vaccine, ideally before next infusion, could push back 1-2 weeks if needed, she is hesitant  2.  Depression, anxiety -Continue Zoloft 50 mg, 3 tablets daily  3.  Fatigue -Continue Provigil 200 mg daily  Follow-up in 6 months or sooner if needed, will let he check in with SuzanneYan.  I spent 30 minutes of face-to-face and non-face-to-face time with patient.  This included previsit chart review, lab review, study review, order entry, electronic health record documentation, patient education.  Butler Denmark, AGNP-C, DNP 06/18/2020, 10:18 AM [facility] [address] [place], [place] [address] [phone]

## 2020-12-17 ENCOUNTER — Telehealth: Payer: Self-pay | Admitting: Neurology

## 2020-12-17 ENCOUNTER — Ambulatory Visit: Payer: Medicare HMO | Admitting: Neurology

## 2020-12-17 ENCOUNTER — Encounter: Payer: Self-pay | Admitting: Neurology

## 2020-12-17 VITALS — BP 137/85 | HR 84 | Ht 63.0 in | Wt 202.5 lb

## 2020-12-17 DIAGNOSIS — R5383 Other fatigue: Secondary | ICD-10-CM | POA: Diagnosis not present

## 2020-12-17 DIAGNOSIS — R202 Paresthesia of skin: Secondary | ICD-10-CM | POA: Insufficient documentation

## 2020-12-17 DIAGNOSIS — F32A Depression, unspecified: Secondary | ICD-10-CM | POA: Diagnosis not present

## 2020-12-17 DIAGNOSIS — G35 Multiple sclerosis: Secondary | ICD-10-CM | POA: Diagnosis not present

## 2020-12-17 MED ORDER — SERTRALINE HCL 100 MG PO TABS: 100.0000 mg | ORAL_TABLET | Freq: Every day | ORAL | 3 refills | Status: DC

## 2020-12-17 MED ORDER — TRAZODONE HCL 100 MG PO TABS: 100.0000 mg | ORAL_TABLET | Freq: Every day | ORAL | 11 refills | Status: DC

## 2020-12-17 NOTE — Progress Notes (Addendum)
ASSESSMENT AND PLAN 50 y.o. year old female   Relapsing remitting multiple sclerosis  Flareup in January 2018, June 2018, possibly December 2019, worsening gait difficulty, left lower extremity weakness  On Ocrevus since October 2018, tolerating it well  Repeat MRI of the brain with without contrast  Laboratory evaluations, including CD19/20, IgG level  Mild cognitive impairment  Mini-Mental Status Examination 29/30 today with animal naming 10 Fatigue  Continue Provigil 200 mg daily   Depression, insomnia  Suboptimal control with Zoloft 150 mg daily, will decrease Zoloft to 100 mg every morning, add on trazodone 100 mg every night,  Gait abnormality, spasticity  Baclofen 10 mg daily,  DIAGNOSTIC DATA (LABS, IMAGING, TESTING) - I reviewed patient records, labs, notes, testing and imaging myself where available.  MRI of the brain with without contrast Jan 16, 2020: Multiple supratentorial chronic demyelinating plaque, no contrast-enhancement, no change from previous MRI in 2019  JC virus was negative with titer of 0.19 in March 2017    Lab Results  Component Value Date   WBC 8.6 12/18/2019   HGB 13.9 12/18/2019   HCT 41.8 12/18/2019   MCV 91 12/18/2019   PLT 274 12/18/2019      Component Value Date/Time   NA 138 12/18/2019 0922   K 4.5 12/18/2019 0922   CL 103 12/18/2019 0922   CO2 24 12/18/2019 0922   GLUCOSE 91 12/18/2019 0922   GLUCOSE 116 (H) 07/13/2012 1216   BUN 15 12/18/2019 0922   CREATININE 1.06 (H) 12/18/2019 0922   CALCIUM 9.5 12/18/2019 0922   PROT 6.3 12/18/2019 0922   ALBUMIN 4.2 12/18/2019 0922   AST 11 12/18/2019 0922   ALT 10 12/18/2019 0922   ALKPHOS 91 12/18/2019 0922   BILITOT 0.2 12/18/2019 0922   GFRNONAA 62 12/18/2019 0922   GFRAA 71 12/18/2019 0922   No results found for: CHOL, HDL, LDLCALC, LDLDIRECT, TRIG, CHOLHDL No results found for: HGBA1C No results found for: VITAMINB12 Lab Results  Component Value Date   TSH 2.170  07/14/2018   PHYSICAL EXAM  Vitals:   12/17/20 1024  BP: 137/85  Pulse: 84  Weight: 202 lb 8 oz (91.9 kg)  Height: _0  (1.6 m)   Body mass index is 35.87 kg/m.  Generalized: Well developed, in no acute distress   Neurological examination  Mentation: Alert oriented to time, place, history taking. Follows all commands speech and language fluent  MMSE - Mini Mental State Exam 12/17/2020  Orientation to time 5  Orientation to Place 5  Registration 3  Attention/ Calculation 5  Recall 2  Language- name 2 objects 2  Language- repeat 1  Language- follow 3 step command 3  Language- read & follow direction 1  Write a sentence 1  Copy design 1  Total score 29   Cranial nerve II-XII: Pupils were equal round reactive to light. Extraocular movements were full, visual field were full on confrontational test. Facial sensation and strength were normal.  Head turning and shoulder shrug  were normal and symmetric. Motor: Good strength extremities, no significant weakness, mild bilateral lower extremity spasticity  Sensory: Sensory testing is intact to light touch Coordination: Cerebellar testing reveals good finger-nose-finger and heel-to-shin bilaterally.  Gait and station: Slightly wide-based, slight limp on the left, tandem gait unsteady, difficulty performing tandem walking Reflexes: Deep tendon reflexes are hyperactive   HISTORY OF PRESENT ILLNESS:  Ms Junio, 7 -year-old female with history of relapsing remitting multiple sclerosis  In August 2013, she noticed  numbness of her left foot, intermittent, faded away in 2 weeks  In September 2013, she noticed left arm numbness, itching underneath her skin, lasting for 2 weeks  In October 2013, she began to notice unbalanced gait, dizziness, progressively worse, woke up one morning noticed double vision, worsening balance difficulty, binocular diplopia, spinning sensation, profound gait difficulty, fatigue, nausea  She was admitted to  [facility], July 13 2012 .  MRI brain ([place]) showed numerous white matter lesions which are predominately periventricular location suspicious for chronic MS, with suspected acute demyelinating lesion in the right temporal lobe periventricular optic radiation. Mild premature atrophy. No contrast was given. MRA brain was normal.  [facility], Jul 27, 2012 MRI of the brain showed Multiple chronic periventricular and subcortical chronic demyelinating plaques.There are 3 enhancing lesions likely acute demyelinating plaques in the right periatrial, left posterior frontal and left occipital regions  MRI cervical spine showed Multiple chronic demyelinating plaques at C2-3, C3, C4, C5-6, and C7. No contrast enhancement   Laboratory evaluation showed normal ESR 10, CMP, CBC, negative Lyme titer, acetylcholine receptor antibody, ANA, TSH,   CSF, WBC 3, RBC 12, negative cryptococcal antigen, 5 oligoclonal bands,total protein 29, glucose 65  VEP 07/25/2012: was within normal limits bilaterally. No evidence of conduction slowing was seen within the anterior visual pathways  Tysabri since December 2013.  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 No enhancing lesions are noted. The presence of several T1 black holes and mild generalized atrophy indicates chronic disease. Compared with previous MRI scan dated 07/13/2012 there appears to be minimum progression of disease activity  She was treated with provigil 100 mg twice a day in 2016, which has helped her fatigue,  Repeat MRI of the brain with without contrast in September 2016 Numerous round and ovoid periventricular,  subcortical and peri-callosal chronic demyelinating plaques. Several of these are hypointense on T1 views. No abnormal lesions are seen on post contrast views.  Compared to MRI on 07/13/12, there are several (at least 4 left hemisphere) new chronic plaques noted in the current study, and not clearly seen in the prior study.--No clinical flareup reported  UPDATE Sept 28th 2017: She now has a new primary care at [facility] Dr. Octavio Graves, we reviewed previous laboratory evaluation in March 2017, continue sliding trending up of abnormal AST 88, ALT 115,  JC virus was negative with titer of 0.19 on December 04 2015,  We have personally reviewed repeat MRI of the brain with and without contrast in September 2017, no change in comparison with 2016, She is concerned about high co-pay with [name] infusion, wants to consider other treatment options, including by mouth forms  Update March 22 2017: She was enrolled into research since November 2017, began to receive treatment since December 2017, in January, she had a flareup of worsening gait abnormality, repeat MRI of the brain with and without contrast on September 30 2016 showed multiple hyperintensity foci, 6 of foci enhancement, which was not present on previous MRIs on August 13 2016,  She was treated with IV steroid 4 day in Jan 2018, with great recovery  Since June 2018, she noticed gradually worsening fatigue, gait abnormality, left leg weakness, right eye pain,  color washing out, which has been persistent since then,  Today she has to walk with a single foot cane up to 300 m, significant fatigue  UPDATE April 11 2018: She is getting ocrelizumab since October 2018, every 6 months, had repeat infusion in March, and then September 2019. He went on social disability since December 2017, she lives with her daughter, who has suffered postpartum depression, she is helping her with grandson.  She is doing very well with her ocrelizumab, no  MS flareup, taking Zoloft 150 mg every day, Xanax as needed  We have personally reviewed MRI of brain with and without contrast in July 2018, there is multiple T2/flair consistent with chronic multiple sclerosis, compared to MRI in Jan 2018, there are 4 new foci that have developed.  UPDATE Jul 14 2018: We have personally reviewed MRI of the brain with without contrast on May 25, 2018 in comparison to previous MRI in July 2018, there was no significant change noted.  She continue complains of fatigue, taking Provigil to 500 mg daily, Zoloft 50 mg 3 tablets every day helps her depression.  UPDATE Jun 19 2019: She is overall doing very well, in December 2019, she had an episode of mildly blurry vision, leg weakness, gait abnormality, trouble getting out of bed, improved with steroid package, symptoms last for 2 weeks, now she is at her baseline, receiving ocrelizumab, tolerating it well, still taking Zoloft 50 mg 3 tablets, Provigil as needed for fatigue  UPDATE Suzanne Robbins 12 2022: She complains of more slow worsening memory loss, continue to deal with depression despite Zoloft 150 mg daily, wake up frequently at nighttime, using bathroom, difficulty to go back to sleep, frequent leg jerking, taking baclofen 10 mg every night, also complains of mild social phobia, tends to stay at home, avoiding crowds,  She also complains of urinary urgency, fatigue, there was no clear flareup episode. Reviewed most recent MRI of the brain with without contrast May 2021: No change from previous MRI, multiple supratentorial chronic demyelinating plaque, no contrast-enhancement.   REVIEW OF SYSTEMS:  Out of a complete 14 system review of symptoms, the patient complains only of the following symptoms, and all other reviewed systems are negative.  See HPI  ALLERGIES: No Known Allergies  HOME MEDICATIONS: Outpatient Medications Prior to Visit  Medication Sig Dispense Refill  . atorvastatin (LIPITOR) 20 MG  tablet 20 mg.    . baclofen (LIORESAL) 10 MG tablet Take 1 tablet (10 mg total) by mouth at bedtime. 30 each 11  . estradiol (ESTRACE) 2 MG tablet Take 2 mg by mouth daily.    Marland Kitchen ibuprofen (ADVIL,MOTRIN) 200 MG tablet Take 400 mg by mouth every 6 (six) hours as needed. For pain    . modafinil (PROVIGIL) 200 MG tablet Take 1 tablet (200 mg total) by mouth daily. 30 tablet 5  . ocrelizumab 600 mg in sodium chloride 0.9 % 500 mL Inject 600 mg every 6 (six) months into the vein.    Marland Kitchen sertraline (ZOLOFT) 50 MG tablet Take 3 tablets (150 mg total) by mouth daily. 270 tablet 4  . ocrelizumab (OCREVUS) 300 MG/10ML injection Ocrevus 30 mg/mL intravenous solution  Inject by intravenous route.     No facility-administered medications prior to visit.    PAST MEDICAL HISTORY: Past Medical History:  Diagnosis Date  . MS (multiple sclerosis) ([place]) 07/13/12  . Uterine cancer ([place]) 2000    PAST SURGICAL HISTORY: Past Surgical History:  Procedure Laterality Date  . ABDOMINAL HYSTERECTOMY  2000    FAMILY HISTORY: Family History  Problem Relation Age of Onset  . Lung cancer Father     SOCIAL HISTORY: Social History   Socioeconomic History  . Marital status: Married    Spouse name: Not on file  . Number of children: 2  . Years of education: 67  . Highest education level: Not on file  Occupational History  . Occupation: Administrator, sports: [employer]  Tobacco Use  . Smoking status: Never Smoker  . Smokeless tobacco: Never Used  Substance and Sexual Activity  . Alcohol use: No  . Drug use: No  . Sexual activity: Not on file  Other Topics Concern  . Not on file  Social History Narrative   Patient lives at home with her daughter. Patient has a two years of college education.   Right handed.   Caffeine- two sodas daily.   Patient is separated.    Social Determinants of Health   Financial Resource Strain: Not on file  Food Insecurity: Not on file  Transportation Needs: Not on file   Physical Activity: Not on file  Stress: Not on file  Social Connections: Not on file  Intimate Partner Violence: Not on file     Evangeline Dakin, [place] 12/17/2020, 10:51 AM [facility] [address] [place],  [address] [phone]

## 2020-12-17 NOTE — Telephone Encounter (Signed)
[employer] VUYE:[id number] (exp. 12/17/20 to 01/16/21) order sent to GI. They will reach out to the patient to schedule.

## 2020-12-20 ENCOUNTER — Telehealth: Payer: Self-pay | Admitting: Neurology

## 2020-12-20 LAB — CBC WITH DIFFERENTIAL/PLATELET
Basophils Absolute: 0 x10E3/uL (ref 0.0–0.2)
Basos: 0 %
EOS (ABSOLUTE): 0 x10E3/uL (ref 0.0–0.4)
Eos: 0 %
Hematocrit: 43 % (ref 34.0–46.6)
Hemoglobin: 14.5 g/dL (ref 11.1–15.9)
Immature Grans (Abs): 0.1 x10E3/uL (ref 0.0–0.1)
Immature Granulocytes: 1 %
Lymphocytes Absolute: 1 x10E3/uL (ref 0.7–3.1)
Lymphs: 10 %
MCH: 30 pg (ref 26.6–33.0)
MCHC: 33.7 g/dL (ref 31.5–35.7)
MCV: 89 fL (ref 79–97)
Monocytes Absolute: 0.7 x10E3/uL (ref 0.1–0.9)
Monocytes: 7 %
Neutrophils Absolute: 8.4 x10E3/uL — ABNORMAL HIGH (ref 1.4–7.0)
Neutrophils: 82 %
Platelets: 270 x10E3/uL (ref 150–450)
RBC: 4.84 x10E6/uL (ref 3.77–5.28)
RDW: 13.1 % (ref 11.7–15.4)
WBC: 10.2 x10E3/uL (ref 3.4–10.8)

## 2020-12-20 LAB — COMPREHENSIVE METABOLIC PANEL
ALT: 17 IU/L (ref 0–32)
AST: 17 IU/L (ref 0–40)
Albumin/Globulin Ratio: 1.8 (ref 1.2–2.2)
Albumin: 4.6 g/dL (ref 3.8–4.8)
Alkaline Phosphatase: 107 IU/L (ref 44–121)
BUN/Creatinine Ratio: 15 (ref 9–23)
BUN: 16 mg/dL (ref 6–24)
Bilirubin Total: 0.7 mg/dL (ref 0.0–1.2)
CO2: 21 mmol/L (ref 20–29)
Calcium: 9.9 mg/dL (ref 8.7–10.2)
Chloride: 100 mmol/L (ref 96–106)
Creatinine, Ser: 1.06 mg/dL — ABNORMAL HIGH (ref 0.57–1.00)
Globulin, Total: 2.5 g/dL (ref 1.5–4.5)
Glucose: 94 mg/dL (ref 65–99)
Potassium: 4.8 mmol/L (ref 3.5–5.2)
Sodium: 138 mmol/L (ref 134–144)
Total Protein: 7.1 g/dL (ref 6.0–8.5)
eGFR: 64 mL/min/{1.73_m2} (ref >59)

## 2020-12-20 LAB — VITAMIN B12: Vitamin B-12: 256 pg/mL (ref 232–1245)

## 2020-12-20 LAB — LIPID PANEL
Chol/HDL Ratio: 2.8 ratio (ref 0.0–4.4)
Cholesterol, Total: 202 mg/dL — ABNORMAL HIGH (ref 100–199)
HDL: 73 mg/dL (ref >39)
LDL Chol Calc (NIH): 112 mg/dL — ABNORMAL HIGH (ref 0–99)
Triglycerides: 97 mg/dL (ref 0–149)
VLDL Cholesterol Cal: 17 mg/dL (ref 5–40)

## 2020-12-20 LAB — IGG, IGA, IGM
IgG (Immunoglobin G), Serum: 631 mg/dL (ref 586–1602)
IgM (Immunoglobulin M), Srm: 38 mg/dL (ref 26–217)
Immunoglobulin A, (IgA) QN, Serum: 95 mg/dL (ref 87–352)

## 2020-12-20 LAB — CD19 AND CD20, FLOW CYTOMETRY

## 2020-12-20 LAB — TSH: TSH: 1.69 u[IU]/mL (ref 0.450–4.500)

## 2020-12-20 LAB — VITAMIN D 25 HYDROXY (VIT D DEFICIENCY, FRACTURES): Vit D, 25-Hydroxy: 49.7 ng/mL (ref 30.0–100.0)

## 2020-12-20 LAB — SYPHILIS: RPR W/REFLEX TO RPR TITER AND TREPONEMAL ANTIBODIES, TRADITIONAL SCREENING AND DIAGNOSIS ALGORITHM: RPR Ser Ql: NONREACTIVE

## 2020-12-20 NOTE — Telephone Encounter (Signed)
Please call patient, laboratory evaluation showed mild elevated LDL,, She should start by diet control, exercise  Mild elevated creatinine 1.06 at her baseline, encourage her increase water intake  Rest of the laboratory evaluation reflected the change for patient on ocrelizumab  I have forwarded laboratory evaluation to her primary care physician Leeanne Rio, MD

## 2020-12-23 NOTE — Telephone Encounter (Signed)
I spoke to the patient and provided her the lab results. She is agreeable to work on diet, exercise and increasing her water intake. She has a pending follow up with her PCP in May.

## 2020-12-26 ENCOUNTER — Ambulatory Visit
Admission: RE | Admit: 2020-12-26 | Discharge: 2020-12-26 | Disposition: A | Discharge status: Home / Self Care | Payer: Medicare HMO | Source: Ambulatory Visit | Attending: Neurology | Admitting: Neurology

## 2020-12-26 ENCOUNTER — Other Ambulatory Visit: Payer: Self-pay

## 2020-12-26 DIAGNOSIS — G35 Multiple sclerosis: Secondary | ICD-10-CM | POA: Diagnosis not present

## 2020-12-26 DIAGNOSIS — R202 Paresthesia of skin: Secondary | ICD-10-CM

## 2020-12-26 MED ORDER — GADOBENATE DIMEGLUMINE 529 MG/ML IV SOLN
20.0000 mL | Freq: Once | INTRAVENOUS | Status: AC | PRN
  Administered 2020-12-26: 20 mL via INTRAVENOUS

## 2021-05-02 ENCOUNTER — Other Ambulatory Visit: Payer: Self-pay | Admitting: Obstetrics & Gynecology

## 2021-05-02 DIAGNOSIS — Z1231 Encounter for screening mammogram for malignant neoplasm of breast: Secondary | ICD-10-CM

## 2021-05-22 ENCOUNTER — Other Ambulatory Visit: Payer: Self-pay | Admitting: *Deleted

## 2021-05-22 MED ORDER — BACLOFEN 10 MG PO TABS: 10.0000 mg | ORAL_TABLET | Freq: Every day | ORAL | 3 refills | Status: DC

## 2021-06-11 ENCOUNTER — Other Ambulatory Visit: Payer: Self-pay

## 2021-06-11 ENCOUNTER — Ambulatory Visit
Admission: RE | Admit: 2021-06-11 | Discharge: 2021-06-11 | Disposition: A | Discharge status: Home / Self Care | Payer: Medicare HMO | Source: Ambulatory Visit | Attending: Obstetrics & Gynecology | Admitting: Obstetrics & Gynecology

## 2021-06-11 DIAGNOSIS — Z1231 Encounter for screening mammogram for malignant neoplasm of breast: Secondary | ICD-10-CM

## 2021-07-03 ENCOUNTER — Ambulatory Visit: Payer: Medicare HMO | Admitting: Neurology

## 2021-07-03 ENCOUNTER — Encounter: Payer: Self-pay | Admitting: Neurology

## 2021-07-03 VITALS — BP 118/73 | HR 71 | Ht 63.0 in | Wt 200.0 lb

## 2021-07-03 DIAGNOSIS — F32A Depression, unspecified: Secondary | ICD-10-CM | POA: Diagnosis not present

## 2021-07-03 DIAGNOSIS — R3915 Urgency of urination: Secondary | ICD-10-CM

## 2021-07-03 DIAGNOSIS — R252 Cramp and spasm: Secondary | ICD-10-CM

## 2021-07-03 DIAGNOSIS — R5383 Other fatigue: Secondary | ICD-10-CM | POA: Diagnosis not present

## 2021-07-03 DIAGNOSIS — G35 Multiple sclerosis: Secondary | ICD-10-CM | POA: Diagnosis not present

## 2021-07-03 NOTE — Progress Notes (Signed)
ASSESSMENT AND PLAN 50 y.o. year old female     Relapsing remitting multiple sclerosis Flareup in January 2018, June 2018, possibly December 2019, worsening gait difficulty, left lower extremity weakness while taking Tysabri, also showed MRI progression -On Ocrevus since October 2018, has been doing very well since,  Depression anxiety  Zoloft 100 mg plus trazodone 100 mg daily  Chronic fatigue  Provigil 1 to 200 mg as needed  Bilateral lower extremity spasticity, mild gait abnormality  Baclofen 10 mg daily, emphasized importance of moderate exercise   DIAGNOSTIC DATA (LABS, IMAGING, TESTING) - I reviewed patient records, labs, notes, testing and imaging myself where available.    MRI brain (with and without) on Konstantina 21, 2022 - Multiple stable periventricular and subcortical foci of chronic demyelinating plaques.  - No acute plaques. No change from 01/16/20.   HISTORY OF PRESENT ILLNESS:  Suzanne Robbins, 16 -year-old female with history of relapsing remitting multiple sclerosis    In August 2013, she noticed numbness of her left foot, intermittent, faded away in 2 weeks  In September 2013, she noticed left arm numbness, itching underneath her skin, lasting for 2 weeks  In October 2013, she began to notice unbalanced gait, dizziness, progressively worse, woke up one morning noticed double vision, worsening balance difficulty, binocular diplopia, spinning sensation, profound gait difficulty, fatigue, nausea   She was admitted to [facility], July 13 2012 .  MRI brain ([place]) showed numerous white matter lesions which are predominately periventricular location suspicious for chronic Suzanne, with suspected acute demyelinating lesion in the right temporal lobe periventricular optic radiation. Mild premature atrophy. No contrast was given. MRA brain was normal.  [facility], Jul 27, 2012 MRI of the brain showed Multiple chronic periventricular and subcortical chronic demyelinating  plaques.There are 3 enhancing lesions likely acute demyelinating plaques in the right periatrial, left posterior frontal and left occipital regions  MRI cervical spine showed Multiple chronic demyelinating plaques at C2-3, C3, C4, C5-6, and C7. No contrast enhancement    Laboratory evaluation showed normal ESR 10, CMP, CBC, negative Lyme titer, acetylcholine receptor antibody, ANA, TSH,    CSF, WBC 3, RBC 12, negative cryptococcal antigen, 5 oligoclonal bands, total protein 29, glucose 65  VEP 07/25/2012: was within normal limits bilaterally. No evidence of conduction slowing was seen within the anterior visual pathways   Tysabri since December 2013.  JC virus antibody was negative in Jul 19, 2102. antibody was intermediate on March 2015. 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 No enhancing lesions are noted. The presence of several T1 black holes and mild generalized atrophy indicates chronic disease. Compared with previous MRI scan dated 07/13/2012 there appears to be minimum progression of disease activity   She was treated with provigil 100 mg twice a day in 2016, which has helped her fatigue,   Repeat MRI of the brain with without contrast in September 2016 Numerous round and ovoid periventricular, subcortical and peri-callosal chronic demyelinating plaques. Several of these are hypointense on T1 views. No abnormal lesions are seen on post contrast views.    Compared to MRI on 07/13/12, there are several (at least 4 left hemisphere) new chronic plaques noted in the current study, and not clearly seen in the prior study.--No clinical flareup  reported   MRI of the brain with and without contrast in September 2017, no change in comparison with 2016, She is  concerned about high co-pay with Tysarbri IV infusion, wants to consider other treatment options, including by mouth forms  While taking Tysabri, she had clinical flareup Clinical flareup in December 2017, with worsening gait abnormality,   MRI of the brain with and without contrast on September 30 2016 showed multiple hyperintensity foci, 6 of foci enhancement, which was not present on previous MRIs on August 13 2016,   She was treated with IV steroid 4 day in Jan 2018, with great recovery   Since June 2018, she noticed gradually worsening fatigue, gait abnormality, left leg weakness, right eye pain, color washing out, which has been persistent since then,  Treatment was switched to ocrelizumab since October 2018, has been doing very well, no clinical flareup    MRI of the brain with without contrast on May 25, 2018 in comparison to previous MRI in July 2018, there was no significant change noted.   She continue complains of fatigue, taking Provigil to 500 mg daily,  Depression Zoloft 50 mg 3 tablets every day helps her depression.  May 2021, MRI of the brain was overall stable, compared to previous in September 2019.    UPDATE 10 27 2022: She is overall doing very well, no clinical flareup, personally reviewed MRI of the brain with without contrast [place] 2022: Compared to previous MRI of the brain, multiple stable periventricular, subcortical chronic demyelinating plaque, no contrast-enhancement, no change from previous scan in May 2021.  Laboratory evaluations in [place] 2022 showed normal range IgG, less than 0.5% of body B-cell, CD19/20 population is not identified, mild elevated LDL 112,  PHYSICAL EXAM  Vitals:   07/03/21 1129  BP: 118/73  Pulse: 71  Weight: 200 lb (90.7 kg)  Height: _0  (1.6 m)   Body mass index is 35.43 kg/m.  PHYSICAL EXAMNIATION:  Gen: NAD, conversant, well nourised, well groomed                     Cardiovascular: Regular rate rhythm, no  peripheral edema, warm, nontender. Eyes: Conjunctivae clear without exudates or hemorrhage Neck: Supple, no carotid bruits. Pulmonary: Clear to auscultation bilaterally   NEUROLOGICAL EXAM:  MENTAL STATUS: Speech/Cognition: Awake, alert, normal speech, oriented to history taking and casual conversation.  CRANIAL NERVES: CN II: Visual fields are full to confrontation.  Pupils are round equal and briskly reactive to light. CN III, IV, VI: extraocular movement are normal. No ptosis. CN V: Facial sensation is intact to light touch. CN VII: Face is symmetric with normal eye closure and smile. CN VIII: Hearing is normal to casual conversation CN IX, X: Palate elevates symmetrically. Phonation is normal. CN XI: Head turning and shoulder shrug are intact  MOTOR: Muscle bulk and tone are normal. Muscle strength is normal.  REFLEXES: Reflexes are 2  and symmetric at the biceps, triceps, 3/3 knees and ankles. Plantar responses are flexor.  SENSORY: Intact to light touch, pinprick, positional and vibratory sensation at fingers and toes.  COORDINATION: There is no trunk or limb ataxia.    GAIT/STANCE: Need to push-up to get up from seated position, wide-based, mildly unsteady  REVIEW OF SYSTEMS: Out of a complete 14 system review of symptoms, the patient complains only of the following symptoms, and all other reviewed systems are negative.  See HPI  ALLERGIES: No Known Allergies  HOME MEDICATIONS: Outpatient Medications Prior to  Visit  Medication Sig Dispense Refill   atorvastatin (LIPITOR) 20 MG tablet 20 mg.     baclofen (LIORESAL) 10 MG tablet Take 1 tablet (10 mg total) by mouth at bedtime. 90 each 3   estradiol (ESTRACE) 2 MG tablet Take 2 mg by mouth daily.     ibuprofen (ADVIL,MOTRIN) 200 MG tablet Take 400 mg by mouth every 6 (six) hours as needed. For pain     modafinil (PROVIGIL) 200 MG tablet Take 1 tablet (200 mg total) by mouth daily. 30 tablet 5   ocrelizumab 600 mg  in sodium chloride 0.9 % 500 mL Inject 600 mg every 6 (six) months into the vein.     sertraline (ZOLOFT) 100 MG tablet Take 1 tablet (100 mg total) by mouth daily. 90 tablet 3   traZODone (DESYREL) 100 MG tablet Take 1 tablet (100 mg total) by mouth at bedtime. 30 tablet 11   No facility-administered medications prior to visit.    PAST MEDICAL HISTORY: Past Medical History:  Diagnosis Date   Suzanne (multiple sclerosis) ([place]) 07/13/12   Uterine cancer ([place]) 2000    PAST SURGICAL HISTORY: Past Surgical History:  Procedure Laterality Date   ABDOMINAL HYSTERECTOMY  2000    FAMILY HISTORY: Family History  Problem Relation Age of Onset   Lung cancer Father    Breast cancer Neg Hx     SOCIAL HISTORY: Social History   Socioeconomic History   Marital status: Married    Spouse name: Not on file   Number of children: 2   Years of education: 14   Highest education level: Not on file  Occupational History   Occupation: Administrator, sports: [employer]  Tobacco Use   Smoking status: Never   Smokeless tobacco: Never  Substance and Sexual Activity   Alcohol use: No   Drug use: No   Sexual activity: Not on file  Other Topics Concern   Not on file  Social History Narrative   Patient lives at home with her daughter. Patient has a two years of college education.   Right handed.   Caffeine- two sodas daily.   Patient is separated.    Social Determinants of Health   Financial Resource Strain: Not on file  Food Insecurity: Not on file  Transportation Needs: Not on file  Physical Activity: Not on file  Stress: Not on file  Social Connections: Not on file  Intimate Partner Violence: Not on file     Marcial Pacas, M.D. Ph.D.  [facility] [place], [place] [address] Phone: [phone] Fax:      [phone]

## 2021-09-09 ENCOUNTER — Other Ambulatory Visit: Payer: Self-pay

## 2021-11-30 ENCOUNTER — Other Ambulatory Visit: Payer: Self-pay | Admitting: Neurology

## 2021-12-01 NOTE — Telephone Encounter (Signed)
Rx's refilled. 

## 2022-01-13 NOTE — Progress Notes (Signed)
? ? ?Patient: Suzanne Robbins ?Date of Birth: 1971-08-30 ? ?Reason for Visit: Follow up for MS ?History from: Patient ?Primary Neurologist: Dr.Yan  ? ?ASSESSMENT AND PLAN ?51 y.o. year old female  ? ?1.  Relapsing remitting multiple sclerosis ?-History of flareup in January 2018, June 2018, possibly December 2019 ?-While on Tysabri worsening gait difficulty, left lower extremity weakness, MRI progression ?-On Ocrevus since October 2018 ?-MRI of the brain in Suzanne Robbins 2022 showed multiple plaques, no change from May 2021 ?-Check MRI of the brain and cervical spine with and without contrast for surveillance, following annually for change ?-Check routine labs on Ocrevus ?-with MS, report of continued urinary urgency with incontinence, new episode of bowel incontinence, we talked about trying Myrbetriq, she will discuss with PCP ? ?2.  Depression, anxiety ?-On Zoloft 100 mg daily, trazodone 100 mg daily ? ?3.  Chronic fatigue ?-Takes Provigil as needed ? ?4.  Bilateral lower extremity spasticity ?-Increase baclofen 15 mg at bedtime ? ?HISTORY ?[name], 89 -year-old female with history of relapsing remitting multiple sclerosis ?  ? In August 2013, she noticed numbness of her left foot, intermittent, faded away in 2 weeks  ?In September 2013, she noticed left arm numbness, itching underneath her skin, lasting for 2 weeks  ?In October 2013, she began to notice unbalanced gait, dizziness, progressively worse, woke up one morning noticed double vision, worsening balance difficulty, binocular diplopia, spinning sensation, profound gait difficulty, fatigue, nausea   ?She was admitted to [facility], July 13 2012 .  ?MRI brain ([place]) showed numerous white matter lesions which are predominately periventricular location suspicious for chronic MS, with suspected acute demyelinating lesion in the right temporal lobe periventricular optic radiation. Mild premature atrophy. No contrast was given. MRA brain was normal.  ?[facility], Jul 27, 2012 MRI of the brain showed Multiple chronic periventricular and subcortical chronic demyelinating plaques.There are 3 enhancing lesions likely acute demyelinating plaques in the right periatrial, left posterior frontal and left occipital regions  ?MRI cervical spine showed Multiple chronic demyelinating plaques at C2-3, C3, C4, C5-6, and C7. No contrast enhancement  ?  ?Laboratory evaluation showed normal ESR 10, CMP, CBC, negative Lyme titer, acetylcholine receptor antibody, ANA, TSH,  ?  ?CSF, WBC 3, RBC 12, negative cryptococcal antigen, 5 oligoclonal bands, total protein 29, glucose 65  ?VEP 07/25/2012: was within normal limits bilaterally. No evidence of conduction slowing was seen within the anterior visual pathways ?  ?Tysabri since December 2013.  ?JC virus antibody was negative in Jul 19, 2102. antibody was intermediate on March 2015. Negative Sep 2015, negative August 23 2014, negative with titer of 0.19 on December 04 2015, ?  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  ?MRI September 2015: numerous periventricular and subcortical white matter hyperintensities compatible with chronic multiple sclerosis. No enhancing lesions are noted. The presence of several T1 black holes and mild generalized atrophy indicates chronic disease. Compared with previous MRI scan dated 07/13/2012 there appears to be minimum progression of disease activity ?  ?She was treated with provigil 100 mg twice a day in 2016, which has helped her fatigue, ?  ?Repeat MRI of the brain with without contrast in September 2016 ?Numerous round and ovoid periventricular, subcortical and peri-callosal chronic demyelinating plaques. Several of these are hypointense on T1 views. ?No abnormal lesions are seen on post contrast views.    ?Compared to MRI on 07/13/12, there are several (at least 4 left  hemisphere) new chronic plaques noted in the current study, and not clearly seen in the prior study.--No clinical flareup  reported ?  ?MRI of the brain with and without contrast in September 2017, no change in comparison with 2016, ?She is concerned about high co-pay with Tysarbri IV infusion, wants to consider other treatment options, including by mouth forms ?  ?While taking Tysabri, she had clinical flareup ?Clinical flareup in December 2017, with worsening gait abnormality,  ?  ?MRI of the brain with and without contrast on September 30 2016 showed multiple hyperintensity foci, 6 of foci enhancement, which was not present on previous MRIs on August 13 2016, ?  ?She was treated with IV steroid 4 day in Jan 2018, with great recovery ?  ?Since June 2018, she noticed gradually worsening fatigue, gait abnormality, left leg weakness, right eye pain, color washing out, which has been persistent since then, ?  ?Treatment was switched to ocrelizumab since October 2018, has been doing very well, no clinical flareup ?  ? MRI of the brain with without contrast on May 25, 2018 in comparison to previous MRI in July 2018, there was no significant change noted. ?  ?She continue complains of fatigue, taking Provigil to 500 mg daily,  ?Depression Zoloft 50 mg 3 tablets every day helps her depression. ?  ?May 2021, MRI of the brain was overall stable, compared to previous in September 2019.   ?  ?UPDATE 10 27 2022: ?She is overall doing very well, no clinical flareup, personally reviewed MRI of the brain with without contrast Suzanne Robbins 2022: Compared to previous MRI of the brain, multiple stable periventricular, subcortical chronic demyelinating plaque, no contrast-enhancement, no change from previous scan in May 2021. ?  ?Laboratory evaluations in Suzanne Robbins 2022 showed normal range IgG, less than 0.5% of body B-cell, CD19/20 population is not identified, mild elevated LDL 112, ? ?Update Jan 14, 2022 Suzanne Robbins: Due for [facility] June 8th at our office, tolerates well, has increased energy, feels easier to move, thoughts are clearer. 3 weeks ago in bed for 5 days  with fatigue, had some allergy issues, had cleaned a lot. Rarely uses Provigil. Can get overwhelmed easily. Consider higher dose of Baclofen, twitches at night with arms, can wake her up from sleep. Legs will give out, knows to slide down basement steps. Has cane if needed. Vision is sometimes cloudy. Has urinary incontinence, 1 week ago, had watery BM in sleep, it woke her up. Takes Vitamin D, magnesium, B 12.  ?  ?REVIEW OF SYSTEMS: Out of a complete 14 system review of symptoms, the patient complains only of the following symptoms, and all other reviewed systems are negative. ? ?See HPI ? ?ALLERGIES: ?No Known Allergies ? ?HOME MEDICATIONS: ?Outpatient Medications Prior to Visit  ?Medication Sig Dispense Refill  ? atorvastatin (LIPITOR) 20 MG tablet 20 mg.    ? estradiol (ESTRACE) 2 MG tablet Take 2 mg by mouth daily.    ? ibuprofen (ADVIL,MOTRIN) 200 MG tablet Take 400 mg by mouth every 6 (six) hours as needed. For pain    ? modafinil (PROVIGIL) 200 MG tablet Take 1 tablet (200 mg total) by mouth daily. 30 tablet 5  ? ocrelizumab 600 mg in sodium chloride 0.9 % 500 mL Inject 600 mg every 6 (six) months into the vein.    ? sertraline (ZOLOFT) 100 MG tablet TAKE 1 TABLET(100 MG) BY MOUTH DAILY 90 tablet 1  ? traZODone (DESYREL) 100 MG tablet TAKE 1 TABLET(100 MG) BY MOUTH  AT BEDTIME 30 tablet 5  ? baclofen (LIORESAL) 10 MG tablet Take 1 tablet (10 mg total) by mouth at bedtime. 90 each 3  ? ?No facility-administered medications prior to visit.  ? ? ?PAST MEDICAL HISTORY: ?Past Medical History:  ?Diagnosis Date  ? MS (multiple sclerosis) ([place]) 07/13/12  ? Uterine cancer ([place]) 2000  ? ? ?PAST SURGICAL HISTORY: ?Past Surgical History:  ?Procedure Laterality Date  ? ABDOMINAL HYSTERECTOMY  2000  ? ? ?FAMILY HISTORY: ?Family History  ?Problem Relation Age of Onset  ? Lung cancer Father   ? Breast cancer Neg Hx   ? ? ?SOCIAL HISTORY: ?Social History  ? ?Socioeconomic History  ? Marital status: Married  ?  Spouse name:  Not on file  ? Number of children: 2  ? Years of education: 32  ? Highest education level: Not on file  ?Occupational History  ? Occupation: banker  ?  Employer: [employer]  ?Tobacco Use  ? Smoking status: Never

## 2022-01-14 ENCOUNTER — Encounter: Payer: Self-pay | Admitting: Neurology

## 2022-01-14 ENCOUNTER — Ambulatory Visit: Payer: Medicare HMO | Admitting: Neurology

## 2022-01-14 VITALS — BP 140/81 | HR 75 | Ht 63.5 in | Wt 194.5 lb

## 2022-01-14 DIAGNOSIS — R269 Unspecified abnormalities of gait and mobility: Secondary | ICD-10-CM

## 2022-01-14 DIAGNOSIS — G35 Multiple sclerosis: Secondary | ICD-10-CM

## 2022-01-14 DIAGNOSIS — R252 Cramp and spasm: Secondary | ICD-10-CM

## 2022-01-14 MED ORDER — BACLOFEN 10 MG PO TABS: 15.0000 mg | ORAL_TABLET | Freq: Every day | ORAL | 3 refills | Status: DC

## 2022-01-14 NOTE — Patient Instructions (Signed)
Increase baclofen to 15 mg at bedtime ?Check labs, MRI imaging ?Keep ocrevus appointment ?See you back in 6 months  ?

## 2022-01-15 ENCOUNTER — Telehealth: Payer: Self-pay | Admitting: Neurology

## 2022-01-15 LAB — CBC WITH DIFFERENTIAL/PLATELET
Basophils Absolute: 0 10*3/uL (ref 0.0–0.2)
Basos: 1 %
EOS (ABSOLUTE): 0.1 10*3/uL (ref 0.0–0.4)
Eos: 1 %
Hematocrit: 37 % (ref 34.0–46.6)
Hemoglobin: 12.9 g/dL (ref 11.1–15.9)
Immature Grans (Abs): 0 10*3/uL (ref 0.0–0.1)
Immature Granulocytes: 1 %
Lymphocytes Absolute: 1.2 10*3/uL (ref 0.7–3.1)
Lymphs: 19 %
MCH: 30.6 pg (ref 26.6–33.0)
MCHC: 34.9 g/dL (ref 31.5–35.7)
MCV: 88 fL (ref 79–97)
Monocytes Absolute: 0.6 10*3/uL (ref 0.1–0.9)
Monocytes: 9 %
Neutrophils Absolute: 4.6 10*3/uL (ref 1.4–7.0)
Neutrophils: 69 %
Platelets: 254 10*3/uL (ref 150–450)
RBC: 4.21 x10E6/uL (ref 3.77–5.28)
RDW: 12.4 % (ref 11.7–15.4)
WBC: 6.6 10*3/uL (ref 3.4–10.8)

## 2022-01-15 LAB — COMPREHENSIVE METABOLIC PANEL
ALT: 13 IU/L (ref 0–32)
AST: 14 IU/L (ref 0–40)
Albumin/Globulin Ratio: 2.4 — ABNORMAL HIGH (ref 1.2–2.2)
Albumin: 4.7 g/dL (ref 3.8–4.9)
Alkaline Phosphatase: 71 IU/L (ref 44–121)
BUN/Creatinine Ratio: 11 (ref 9–23)
BUN: 11 mg/dL (ref 6–24)
Bilirubin Total: 0.6 mg/dL (ref 0.0–1.2)
CO2: 23 mmol/L (ref 20–29)
Calcium: 9.5 mg/dL (ref 8.7–10.2)
Chloride: 104 mmol/L (ref 96–106)
Creatinine, Ser: 1 mg/dL (ref 0.57–1.00)
Globulin, Total: 2 g/dL (ref 1.5–4.5)
Glucose: 96 mg/dL (ref 70–99)
Potassium: 3.9 mmol/L (ref 3.5–5.2)
Sodium: 142 mmol/L (ref 134–144)
Total Protein: 6.7 g/dL (ref 6.0–8.5)
eGFR: 68 mL/min/{1.73_m2} (ref >59)

## 2022-01-15 LAB — IGG, IGA, IGM
IgA/Immunoglobulin A, Serum: 83 mg/dL — ABNORMAL LOW (ref 87–352)
IgG (Immunoglobin G), Serum: 703 mg/dL (ref 586–1602)
IgM (Immunoglobulin M), Srm: 37 mg/dL (ref 26–217)

## 2022-01-15 NOTE — Telephone Encounter (Signed)
Mcarthur Rossetti Josem Kaufmann: [phone] & [phone] (exp. 01/15/22 to 02/14/22) order sent to GI, they will reach out to the patient to schedule.  ?

## 2022-01-27 ENCOUNTER — Ambulatory Visit
Admission: RE | Admit: 2022-01-27 | Discharge: 2022-01-27 | Disposition: A | Discharge status: Home / Self Care | Payer: Medicare HMO | Source: Ambulatory Visit | Attending: Neurology | Admitting: Neurology

## 2022-01-27 DIAGNOSIS — G35 Multiple sclerosis: Secondary | ICD-10-CM

## 2022-01-27 MED ORDER — GADOBENATE DIMEGLUMINE 529 MG/ML IV SOLN
18.0000 mL | Freq: Once | INTRAVENOUS | Status: AC | PRN
  Administered 2022-01-27: 18 mL via INTRAVENOUS

## 2022-02-21 LAB — COLOGUARD: COLOGUARD: NEGATIVE

## 2022-04-14 ENCOUNTER — Telehealth: Payer: Self-pay | Admitting: Neurology

## 2022-04-14 NOTE — Telephone Encounter (Signed)
Suzanne Robbins/ Suzanne Robbins is calling to notify that the accepting form is expiring at the end of the month.

## 2022-04-15 NOTE — Telephone Encounter (Signed)
This is regarding her Ocrevus infusion. The information has been provided to [facility].

## 2022-05-06 ENCOUNTER — Other Ambulatory Visit: Payer: Self-pay | Admitting: Neurology

## 2022-05-07 NOTE — Telephone Encounter (Signed)
Rx filled to appointment date to avoid disruption of care.

## 2022-05-25 ENCOUNTER — Other Ambulatory Visit: Payer: Self-pay | Admitting: Neurology

## 2022-05-25 NOTE — Telephone Encounter (Signed)
Rx refilled.

## 2022-07-22 NOTE — Progress Notes (Unsigned)
Patient: Suzanne Robbins Date of Birth: 1970-12-14  Reason for Visit: Follow up for Suzanne History from: Patient Primary Neurologist: Dr.Yan   ASSESSMENT AND PLAN 51 y.o. year old female   65.  Relapsing remitting multiple sclerosis -History of flareup in January 2018, June 2018, possibly December 2019 -While on Tysabri worsening gait difficulty, left lower extremity weakness, MRI progression -On Ocrevus since October 2018 -MRI of the brain and cervical spine in May 2023 showed chronic plaques, T1 black holes, hyperintensities at C3-4, C5, C7 likely represent plaques, overall no significant change or new lesions -Check routine labs on Ocrevus -Encouraged to continue exercise, commended on her 20 lb weight loss!  2.  Depression, anxiety -Doing well -On Zoloft 100 mg daily, trazodone 100 mg daily  3.  Chronic fatigue -No longer needs Provigil  4.  Bilateral lower extremity spasticity -Continue baclofen 15 mg at bedtime  HISTORY Suzanne Robbins, 34 -year-old female with history of relapsing remitting multiple sclerosis    In August 2013, she noticed numbness of her left foot, intermittent, faded away in 2 weeks  In September 2013, she noticed left arm numbness, itching underneath her skin, lasting for 2 weeks  In October 2013, she began to notice unbalanced gait, dizziness, progressively worse, woke up one morning noticed double vision, worsening balance difficulty, binocular diplopia, spinning sensation, profound gait difficulty, fatigue, nausea   She was admitted to [facility], July 13 2012 .  MRI brain ([place]) showed numerous white matter lesions which are predominately periventricular location suspicious for chronic Suzanne, with suspected acute demyelinating lesion in the right temporal lobe periventricular optic radiation. Mild premature atrophy. No contrast was given. MRA brain was normal.  [facility], Jul 27, 2012 MRI of the brain showed Multiple chronic periventricular and  subcortical chronic demyelinating plaques.There are 3 enhancing lesions likely acute demyelinating plaques in the right periatrial, left posterior frontal and left occipital regions  MRI cervical spine showed Multiple chronic demyelinating plaques at C2-3, C3, C4, C5-6, and C7. No contrast enhancement    Laboratory evaluation showed normal ESR 10, CMP, CBC, negative Lyme titer, acetylcholine receptor antibody, ANA, TSH,    CSF, WBC 3, RBC 12, negative cryptococcal antigen, 5 oligoclonal bands, total protein 29, glucose 65  VEP 07/25/2012: was within normal limits bilaterally. No evidence of conduction slowing was seen within the anterior visual pathways   Tysabri since December 2013.  JC virus antibody was negative in Jul 19, 2102. antibody was intermediate on March 2015. 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 No enhancing lesions are noted. The presence of several T1 black holes and mild generalized atrophy indicates chronic disease. Compared with previous MRI scan dated 07/13/2012 there appears to be minimum progression of disease activity   She was treated with provigil 100 mg twice a day in 2016, which has helped her fatigue,   Repeat MRI of the brain with without contrast in September 2016 Numerous round and ovoid periventricular, subcortical and peri-callosal chronic demyelinating plaques. Several of these are hypointense on T1 views. No abnormal lesions are seen on post contrast views.    Compared to MRI on 07/13/12, there are several (at least 4 left hemisphere) new chronic plaques noted in the current study, and not clearly seen in  the prior study.--No clinical flareup reported   MRI of the brain with and without contrast in September 2017, no change in  comparison with 2016, She is concerned about high co-pay with Tysarbri IV infusion, wants to consider other treatment options, including by mouth forms   While taking Tysabri, she had clinical flareup Clinical flareup in December 2017, with worsening gait abnormality,    MRI of the brain with and without contrast on September 30 2016 showed multiple hyperintensity foci, 6 of foci enhancement, which was not present on previous MRIs on August 13 2016,   She was treated with IV steroid 4 day in Jan 2018, with great recovery   Since June 2018, she noticed gradually worsening fatigue, gait abnormality, left leg weakness, right eye pain, color washing out, which has been persistent since then,   Treatment was switched to ocrelizumab since October 2018, has been doing very well, no clinical flareup    MRI of the brain with without contrast on May 25, 2018 in comparison to previous MRI in July 2018, there was no significant change noted.   She continue complains of fatigue, taking Provigil to 500 mg daily,  Depression Zoloft 50 mg 3 tablets every day helps her depression.   May 2021, MRI of the brain was overall stable, compared to previous in September 2019.     UPDATE 10 27 2022: She is overall doing very well, no clinical flareup, personally reviewed MRI of the brain with without contrast [name] 2022: Compared to previous MRI of the brain, multiple stable periventricular, subcortical chronic demyelinating plaque, no contrast-enhancement, no change from previous scan in May 2021.   Laboratory evaluations in [name] 2022 showed normal range IgG, less than 0.5% of body B-cell, CD19/20 population is not identified, mild elevated LDL 112,  Update Jan 14, 2022 SS: Due for [facility] June 8th at our office, tolerates well, has increased energy, feels easier to move, thoughts are clearer. 3 weeks ago in bed for 5 days with fatigue, had some allergy issues, had cleaned a lot. Rarely uses Provigil. Can get  overwhelmed easily. Consider higher dose of Baclofen, twitches at night with arms, can wake her up from sleep. Legs will give out, knows to slide down basement steps. Has cane if needed. Vision is sometimes cloudy. Has urinary incontinence, 1 week ago, had watery BM in sleep, it woke her up. Takes Vitamin D, magnesium, B 12.   Update July 23, 2022 SS: Doing well, next De Nurse is Dec 21st. Her last infusion, experienced big boost of energy. Got new glasses, no major changes. Still have numbness under left arm with shaving. Balance is good, no falls, doing home exercises to keep moving, has lost 20 lbs in 6 months. B/B are okay. Doesn't take Provigil. Sleeping well with trazodone. Is on Vitamin D, B 12, mag.  MRI of the brain and cervical spine were overall stable in May 2023.  Labs showed normal IgG and IgM. CBC and CMP were unremarkable.  IMPRESSION: MRI scan of the brain with and without contrast showing bilateral periventricular and subcortical white matter hyperintensities in a pattern and distribution compatible with chronic demyelinating disease.  No enhancing lesions are noted.  Presence of T1 black holes indicates chronic disease.  Overall no significant change compared with previous MRI films from 12/26/2020   IMPRESSION: MRI scan cervical spine with and without contrast showing paracentral disc osteophyte protrusion at C5-6 resulting in moderate left and mild right-sided foraminal narrowing with no compression.  Ill-defined spinal  cord hyperintensities are noted at C 3-4 and to a lesser extent at C5 and C7 which likely represent remote age demyelinating plaques.  Overall probably no significant change compared with previous MRI C-spine report from 2014    REVIEW OF SYSTEMS: Out of a complete 14 system review of symptoms, the patient complains only of the following symptoms, and all other reviewed systems are negative.  See HPI  ALLERGIES: No Known Allergies  HOME MEDICATIONS: Outpatient  Medications Prior to Visit  Medication Sig Dispense Refill   atorvastatin (LIPITOR) 20 MG tablet 20 mg.     Cyanocobalamin (B12 LIQUID HEALTH BOOSTER PO) Take by mouth daily.     estradiol (ESTRACE) 2 MG tablet Take 2 mg by mouth daily.     ibuprofen (ADVIL,MOTRIN) 200 MG tablet Take 400 mg by mouth every 6 (six) hours as needed. For pain     MAGNESIUM PO Take 1 tablet by mouth at bedtime.     modafinil (PROVIGIL) 200 MG tablet Take 1 tablet (200 mg total) by mouth daily. (Patient taking differently: Take 200 mg by mouth as needed.) 30 tablet 5   ocrelizumab 600 mg in sodium chloride 0.9 % 500 mL Inject 600 mg every 6 (six) months into the vein.     baclofen (LIORESAL) 10 MG tablet Take 1.5 tablets (15 mg total) by mouth at bedtime. 135 each 3   sertraline (ZOLOFT) 100 MG tablet TAKE 1 TABLET(100 MG) BY MOUTH DAILY 90 tablet 1   traZODone (DESYREL) 100 MG tablet TAKE 1 TABLET(100 MG) BY MOUTH AT BEDTIME 30 tablet 3   No facility-administered medications prior to visit.    PAST MEDICAL HISTORY: Past Medical History:  Diagnosis Date   Suzanne (multiple sclerosis) ([place]) 07/13/12   Uterine cancer ([place]) 2000    PAST SURGICAL HISTORY: Past Surgical History:  Procedure Laterality Date   ABDOMINAL HYSTERECTOMY  2000    FAMILY HISTORY: Family History  Problem Relation Age of Onset   Lung cancer Father    Breast cancer Neg Hx     SOCIAL HISTORY: Social History   Socioeconomic History   Marital status: Married    Spouse name: Not on file   Number of children: 2   Years of education: 14   Highest education level: Not on file  Occupational History   Occupation: Administrator, sports: [employer]  Tobacco Use   Smoking status: Never   Smokeless tobacco: Never  Substance and Sexual Activity   Alcohol use: No   Drug use: No   Sexual activity: Not on file  Other Topics Concern   Not on file  Social History Narrative   Patient lives at home with her daughter. Patient has a two years of  college education.   Right handed.   Caffeine- two sodas daily.   Patient is separated.    Social Determinants of Health   Financial Resource Strain: Not on file  Food Insecurity: Not on file  Transportation Needs: Not on file  Physical Activity: Not on file  Stress: Not on file  Social Connections: Not on file  Intimate Partner Violence: Not on file    PHYSICAL EXAM  Vitals:   07/23/22 1303  BP: 130/84  Pulse: 74  Weight: 177 lb 8 oz (80.5 kg)  Height: _0  (1.6 m)    Body mass index is 31.44 kg/m.  Generalized: Well developed, in no acute distress  Neurological examination  Mentation: Alert oriented to time, place, history taking. Follows all  commands speech and language fluent, very nice pleasant Cranial nerve II-XII: Pupils were equal round reactive to light. Extraocular movements were full, visual field were full on confrontational test. Facial sensation and strength were normal.Head turning and shoulder shrug  were normal and symmetric. Motor: overall intact, no significant muscle weakness, mild left knee flexion extension weakness  Sensory: Sensory testing is intact to soft touch on all 4 extremities. No evidence of extinction is noted.  Coordination: Cerebellar testing reveals good finger-nose-finger and heel-to-shin bilaterally.  Gait and station: Has to push off from seated position, gait is cautious, wide-based Reflexes: Deep tendon reflexes are symmetric but are increased throughout  DIAGNOSTIC DATA (LABS, IMAGING, TESTING) - I reviewed patient records, labs, notes, testing and imaging myself where available.  Lab Results  Component Value Date   WBC 6.6 01/14/2022   HGB 12.9 01/14/2022   HCT 37.0 01/14/2022   MCV 88 01/14/2022   PLT 254 01/14/2022      Component Value Date/Time   NA 142 01/14/2022 1139   K 3.9 01/14/2022 1139   CL 104 01/14/2022 1139   CO2 23 01/14/2022 1139   GLUCOSE 96 01/14/2022 1139   GLUCOSE 116 (H) 07/13/2012 1216   BUN 11  01/14/2022 1139   CREATININE 1.00 01/14/2022 1139   CALCIUM 9.5 01/14/2022 1139   PROT 6.7 01/14/2022 1139   ALBUMIN 4.7 01/14/2022 1139   AST 14 01/14/2022 1139   ALT 13 01/14/2022 1139   ALKPHOS 71 01/14/2022 1139   BILITOT 0.6 01/14/2022 1139   GFRNONAA 62 12/18/2019 0922   GFRAA 71 12/18/2019 0922   Lab Results  Component Value Date   CHOL 202 (H) 12/17/2020   HDL 73 12/17/2020   LDLCALC 112 (H) 12/17/2020   TRIG 97 12/17/2020   CHOLHDL 2.8 12/17/2020   No results found for: "HGBA1C" Lab Results  Component Value Date   VITAMINB12 256 12/17/2020   Lab Results  Component Value Date   TSH 1.690 12/17/2020    Butler Denmark, AGNP-C, DNP 07/23/2022, 1:26 PM [facility] [address], [place], [place] [address] [phone]

## 2022-07-23 ENCOUNTER — Encounter: Payer: Self-pay | Admitting: Neurology

## 2022-07-23 ENCOUNTER — Ambulatory Visit: Payer: Medicare HMO | Admitting: Neurology

## 2022-07-23 VITALS — BP 130/84 | HR 74 | Ht 63.0 in | Wt 177.5 lb

## 2022-07-23 DIAGNOSIS — F32A Depression, unspecified: Secondary | ICD-10-CM | POA: Diagnosis not present

## 2022-07-23 DIAGNOSIS — R252 Cramp and spasm: Secondary | ICD-10-CM

## 2022-07-23 DIAGNOSIS — G35 Multiple sclerosis: Secondary | ICD-10-CM | POA: Diagnosis not present

## 2022-07-23 MED ORDER — TRAZODONE HCL 100 MG PO TABS: ORAL_TABLET | ORAL | 3 refills | Status: DC

## 2022-07-23 MED ORDER — BACLOFEN 10 MG PO TABS: 15.0000 mg | ORAL_TABLET | Freq: Every day | ORAL | 11 refills | Status: DC

## 2022-07-23 MED ORDER — SERTRALINE HCL 100 MG PO TABS: ORAL_TABLET | ORAL | 3 refills | Status: DC

## 2022-07-24 LAB — CBC WITH DIFFERENTIAL/PLATELET
Basophils Absolute: 0 10*3/uL (ref 0.0–0.2)
Basos: 1 %
EOS (ABSOLUTE): 0 10*3/uL (ref 0.0–0.4)
Eos: 0 %
Hematocrit: 37.3 % (ref 34.0–46.6)
Hemoglobin: 12.2 g/dL (ref 11.1–15.9)
Immature Grans (Abs): 0 10*3/uL (ref 0.0–0.1)
Immature Granulocytes: 0 %
Lymphocytes Absolute: 1.2 10*3/uL (ref 0.7–3.1)
Lymphs: 17 %
MCH: 29.2 pg (ref 26.6–33.0)
MCHC: 32.7 g/dL (ref 31.5–35.7)
MCV: 89 fL (ref 79–97)
Monocytes Absolute: 0.6 10*3/uL (ref 0.1–0.9)
Monocytes: 8 %
Neutrophils Absolute: 5.6 10*3/uL (ref 1.4–7.0)
Neutrophils: 74 %
Platelets: 246 10*3/uL (ref 150–450)
RBC: 4.18 x10E6/uL (ref 3.77–5.28)
RDW: 12.6 % (ref 11.7–15.4)
WBC: 7.5 10*3/uL (ref 3.4–10.8)

## 2022-07-24 LAB — COMPREHENSIVE METABOLIC PANEL
ALT: 9 IU/L (ref 0–32)
AST: 14 IU/L (ref 0–40)
Albumin/Globulin Ratio: 2.8 — ABNORMAL HIGH (ref 1.2–2.2)
Albumin: 4.8 g/dL (ref 3.8–4.9)
Alkaline Phosphatase: 76 IU/L (ref 44–121)
BUN/Creatinine Ratio: 8 — ABNORMAL LOW (ref 9–23)
BUN: 9 mg/dL (ref 6–24)
Bilirubin Total: 0.7 mg/dL (ref 0.0–1.2)
CO2: 24 mmol/L (ref 20–29)
Calcium: 9.6 mg/dL (ref 8.7–10.2)
Chloride: 102 mmol/L (ref 96–106)
Creatinine, Ser: 1.07 mg/dL — ABNORMAL HIGH (ref 0.57–1.00)
Globulin, Total: 1.7 g/dL (ref 1.5–4.5)
Glucose: 92 mg/dL (ref 70–99)
Potassium: 4.9 mmol/L (ref 3.5–5.2)
Sodium: 137 mmol/L (ref 134–144)
Total Protein: 6.5 g/dL (ref 6.0–8.5)
eGFR: 62 mL/min/{1.73_m2} (ref >59)

## 2022-07-24 LAB — IGG, IGA, IGM
IgA/Immunoglobulin A, Serum: 92 mg/dL (ref 87–352)
IgG (Immunoglobin G), Serum: 690 mg/dL (ref 586–1602)
IgM (Immunoglobulin M), Srm: 34 mg/dL (ref 26–217)

## 2023-01-21 ENCOUNTER — Encounter: Payer: Self-pay | Admitting: Neurology

## 2023-01-21 ENCOUNTER — Ambulatory Visit: Payer: Medicare HMO | Admitting: Neurology

## 2023-01-21 VITALS — BP 132/74 | HR 71 | Ht 63.0 in | Wt 181.0 lb

## 2023-01-21 DIAGNOSIS — R252 Cramp and spasm: Secondary | ICD-10-CM | POA: Diagnosis not present

## 2023-01-21 DIAGNOSIS — G35 Multiple sclerosis: Secondary | ICD-10-CM | POA: Diagnosis not present

## 2023-01-21 DIAGNOSIS — R3915 Urgency of urination: Secondary | ICD-10-CM | POA: Diagnosis not present

## 2023-01-21 MED ORDER — SERTRALINE HCL 100 MG PO TABS: ORAL_TABLET | ORAL | 3 refills | Status: DC

## 2023-01-21 MED ORDER — TRAZODONE HCL 100 MG PO TABS: ORAL_TABLET | ORAL | 3 refills | Status: DC

## 2023-01-21 MED ORDER — BACLOFEN 10 MG PO TABS: 15.0000 mg | ORAL_TABLET | Freq: Every day | ORAL | 3 refills | Status: DC

## 2023-01-21 NOTE — Patient Instructions (Addendum)
Consider Myrbetriq for your bladder Check labs today  Continue other medications

## 2023-01-21 NOTE — Progress Notes (Signed)
Patient: Suzanne Robbins Date of Birth: Jul 17, 1971  Reason for Visit: Follow up for MS History from: Patient Primary Neurologist: Dr.Yan   ASSESSMENT AND PLAN 52 y.o. year old female   1.  Relapsing remitting multiple sclerosis -History of flareup in January 2018, June 2018, possibly December 2019 -While on Tysabri worsening gait difficulty, left lower extremity weakness, MRI progression -On Ocrevus since October 2018 -MRI of the brain and cervical spine in May 2023 showed chronic plaques, T1 black holes, hyperintensities at C3-4, C5, C7 likely represent plaques, overall no significant change or new lesions -Check routine labs on Ocrevus, infusion scheduled for next month -MS overall stable, may consider repeating MRI of the brain in the next 1-2 visits -Encouraged to continue exercise, remains on vitamin D supplement  2.  Depression, anxiety -Doing fairly well, stress with her husband -On Zoloft 100 mg daily, trazodone 100 mg daily (sleeping better)  3.  Chronic fatigue -No longer needs Provigil, was expensive  4.  Bilateral lower extremity spasticity -Continue baclofen 15 mg at bedtime, refilled  5.  Urinary urgency -We consider Myrbetriq, she wants to think about it  Follow-up in 6 months with Dr. Terrace Robbins to rotate visits with me.  Patient is overall doing well, no new issues or concerns.  HISTORY [name], 53 -year-old female with history of relapsing remitting multiple sclerosis    In August 2013, she noticed numbness of her left foot, intermittent, faded away in 2 weeks  In September 2013, she noticed left arm numbness, itching underneath her skin, lasting for 2 weeks  In October 2013, she began to notice unbalanced gait, dizziness, progressively worse, woke up one morning noticed double vision, worsening balance difficulty, binocular diplopia, spinning sensation, profound gait difficulty, fatigue, nausea   She was admitted to [facility], July 13 2012 .  MRI  brain (MSC) showed numerous white matter lesions which are predominately periventricular location suspicious for chronic MS, with suspected acute demyelinating lesion in the right temporal lobe periventricular optic radiation. Mild premature atrophy. No contrast was given. MRA brain was normal.  [facility], Jul 27, 2012 MRI of the brain showed Multiple chronic periventricular and subcortical chronic demyelinating plaques.There are 3 enhancing lesions likely acute demyelinating plaques in the right periatrial, left posterior frontal and left occipital regions  MRI cervical spine showed Multiple chronic demyelinating plaques at C2-3, C3, C4, C5-6, and C7. No contrast enhancement    Laboratory evaluation showed normal ESR 10, CMP, CBC, negative Lyme titer, acetylcholine receptor antibody, ANA, TSH,    CSF, WBC 3, RBC 12, negative cryptococcal antigen, 5 oligoclonal bands, total protein 29, glucose 65  VEP 07/25/2012: was within normal limits bilaterally. No evidence of conduction slowing was seen within the anterior visual pathways   Tysabri since December 2013.  JC virus antibody was negative in Jul 19, 2102. antibody was intermediate on March 2015. Negative Sep 2015, negative August 23 2014, negative with titer of 0.19 on December 04 2015,   She works at [employer], Health and safety inspector job,  Zoloft 50 mg     MRI September 2015: numerous periventricular and subcortical white matter hyperintensities compatible with chronic multiple sclerosis. No enhancing lesions are noted. The presence of several T1 black holes and mild generalized atrophy indicates chronic disease. Compared with previous MRI scan dated 07/13/2012 there appears to be minimum progression of disease activity   She was treated with provigil 100 mg twice a day in 2016, which has helped her fatigue,   Repeat MRI  of the brain with without contrast in September 2016 Numerous round and ovoid periventricular, subcortical and peri-callosal chronic demyelinating  plaques. Several of these are hypointense on T1 views. No abnormal lesions are seen on post contrast views.    Compared to MRI on 07/13/12, there are several (at least 4 left hemisphere) new chronic plaques noted in the current study, and not clearly seen in the prior study.--No clinical flareup reported   MRI of the brain with and without contrast in September 2017, no change in comparison with 2016, She is concerned about high co-pay with Tysarbri IV infusion, wants to consider other treatment options, including by mouth forms   While taking Tysabri, she had clinical flareup Clinical flareup in December 2017, with worsening gait abnormality,    MRI of the brain with and without contrast on September 30 2016 showed multiple hyperintensity foci, 6 of foci enhancement, which was not present on previous MRIs on August 13 2016,   She was treated with IV steroid 4 day in Jan 2018, with great recovery   Since June 2018, she noticed gradually worsening fatigue, gait abnormality, left leg weakness, right eye pain, color washing out, which has been persistent since then,   Treatment was switched to ocrelizumab since October 2018, has been doing very well, no clinical flareup    MRI of the brain with without contrast on May 25, 2018 in comparison to previous MRI in July 2018, there was no significant change noted.   She continue complains of fatigue, taking Provigil to 500 mg daily,  Depression Zoloft 50 mg 3 tablets every day helps her depression.   May 2021, MRI of the brain was overall stable, compared to previous in September 2019.     UPDATE 10 27 2022: She is overall doing very well, no clinical flareup, personally reviewed MRI of the brain with without contrast Suzanne Robbins 2022: Compared to previous MRI of the brain, multiple stable periventricular, subcortical chronic demyelinating plaque, no contrast-enhancement, no change from previous scan in May 2021.   Laboratory evaluations in Suzanne Robbins  2022 showed normal range IgG, less than 0.5% of body B-cell, CD19/20 population is not identified, mild elevated LDL 112,  Update Jan 14, 2022 SS: Due for [facility] June 8th at our office, tolerates well, has increased energy, feels easier to move, thoughts are clearer. 3 weeks ago in bed for 5 days with fatigue, had some allergy issues, had cleaned a lot. Rarely uses Provigil. Can get overwhelmed easily. Consider higher dose of Baclofen, twitches at night with arms, can wake her up from sleep. Legs will give out, knows to slide down basement steps. Has cane if needed. Vision is sometimes cloudy. Has urinary incontinence, 1 week ago, had watery BM in sleep, it woke her up. Takes Vitamin D, magnesium, B 12.   Update July 23, 2022 SS: Doing well, next Em[facility] Morgan is Dec 21st. Her last infusion, experienced big boost of energy. Got new glasses, no major changes. Still have numbness under left arm with shaving. Balance is good, no falls, doing home exercises to keep moving, has lost 20 lbs in 6 months. B/B are okay. Doesn't take Provigil. Sleeping well with trazodone. Is on Vitamin D, B 12, mag.  MRI of the brain and cervical spine were overall stable in May 2023.  Labs showed normal IgG and IgM. CBC and CMP were unremarkable.  IMPRESSION: MRI scan of the brain with and without contrast showing bilateral periventricular and subcortical white matter hyperintensities in a pattern and  distribution compatible with chronic demyelinating disease.  No enhancing lesions are noted.  Presence of T1 black holes indicates chronic disease.  Overall no significant change compared with previous MRI films from 12/26/2020   IMPRESSION: MRI scan cervical spine with and without contrast showing paracentral disc osteophyte protrusion at C5-6 resulting in moderate left and mild right-sided foraminal narrowing with no compression.  Ill-defined spinal cord hyperintensities are noted at C 3-4 and to a lesser extent at C5 and C7 which  likely represent remote age demyelinating plaques.  Overall probably no significant change compared with previous MRI C-spine report from 2014   Update Jan 21, 2023 SS: Normal IgG IgA IgM November 2023. Ocrevus planned for next month. MRI brain cervical spine May 2023 was stable, insurance issue, told still has bill. Has urinary urgency, wears depends mostly, has been doing Kegel's. Going to the gym, walking on treadmill, balance is okay, no falls, still at night left arm leg feel numb. Takes baclofen 15 mg at bedtime, trazodone 100 mg, sleeps well. On zoloft. Has a lot going on at home with her husbands mental health, stress. Takes B 12, Vitamin D, Magnesium.    REVIEW OF SYSTEMS: Out of a complete 14 system review of symptoms, the patient complains only of the following symptoms, and all other reviewed systems are negative.  See HPI  ALLERGIES: No Known Allergies  HOME MEDICATIONS: Outpatient Medications Prior to Visit  Medication Sig Dispense Refill   atorvastatin (LIPITOR) 20 MG tablet 20 mg.     Cyanocobalamin (B12 LIQUID HEALTH BOOSTER PO) Take by mouth daily.     estradiol (ESTRACE) 2 MG tablet Take 2 mg by mouth daily.     ibuprofen (ADVIL,MOTRIN) 200 MG tablet Take 400 mg by mouth every 6 (six) hours as needed. For pain     MAGNESIUM PO Take 1 tablet by mouth at bedtime.     ocrelizumab 600 mg in sodium chloride 0.9 % 500 mL Inject 600 mg every 6 (six) months into the vein.     baclofen (LIORESAL) 10 MG tablet Take 1.5 tablets (15 mg total) by mouth at bedtime. 135 each 11   sertraline (ZOLOFT) 100 MG tablet TAKE 1 TABLET(100 MG) BY MOUTH DAILY 90 tablet 3   traZODone (DESYREL) 100 MG tablet TAKE 1 TABLET(100 MG) BY MOUTH AT BEDTIME 90 tablet 3   modafinil (PROVIGIL) 200 MG tablet Take 1 tablet (200 mg total) by mouth daily. (Patient taking differently: Take 200 mg by mouth as needed.) 30 tablet 5   No facility-administered medications prior to visit.    PAST MEDICAL  HISTORY: Past Medical History:  Diagnosis Date   MS (multiple sclerosis) (HCC) 07/13/12   Uterine cancer (HCC) 2000    PAST SURGICAL HISTORY: Past Surgical History:  Procedure Laterality Date   ABDOMINAL HYSTERECTOMY  2000    FAMILY HISTORY: Family History  Problem Relation Age of Onset   Lung cancer Father    Breast cancer Neg Hx     SOCIAL HISTORY: Social History   Socioeconomic History   Marital status: Married    Spouse name: Not on file   Number of children: 2   Years of education: 14   Highest education level: Not on file  Occupational History   Occupation: Psychologist, sport and exercise: [employer]  Tobacco Use   Smoking status: Never   Smokeless tobacco: Never  Substance and Sexual Activity   Alcohol use: No   Drug use: No   Sexual activity: Yes  Birth control/protection: None  Other Topics Concern   Not on file  Social History Narrative   Patient lives at home with her daughter. Patient has a two years of college education.   Right handed.   Caffeine- two sodas daily.   Patient is separated.    Social Determinants of Health   Financial Resource Strain: Not on file  Food Insecurity: Not on file  Transportation Needs: Not on file  Physical Activity: Not on file  Stress: Not on file  Social Connections: Not on file  Intimate Partner Violence: Not on file    PHYSICAL EXAM  Vitals:   01/21/23 1245  BP: 132/74  Pulse: 71  Weight: 181 lb (82.1 kg)  Height: 5\' 3"  (1.6 m)   Body mass index is 32.06 kg/m.  Generalized: Well developed, in no acute distress  Neurological examination  Mentation: Alert oriented to time, place, history taking. Follows all commands speech and language fluent, very nice pleasant Cranial nerve II-XII: Pupils were equal round reactive to light. Extraocular movements were full, visual field were full on confrontational test. Facial sensation and strength were normal.Head turning and shoulder shrug  were normal and symmetric. Motor:  overall intact, 4/5 right hip flexion Sensory: Sensory testing is intact to soft touch on all 4 extremities. No evidence of extinction is noted.  Coordination: Cerebellar testing reveals good finger-nose-finger and heel-to-shin bilaterally.  Gait and station: Has to push off from seated position, gait is cautious, wide-based, slight limp on the right Reflexes: Deep tendon reflexes are symmetric but are increased throughout  DIAGNOSTIC DATA (LABS, IMAGING, TESTING) - I reviewed patient records, labs, notes, testing and imaging myself where available.  Lab Results  Component Value Date   WBC 7.5 07/23/2022   HGB 12.2 07/23/2022   HCT 37.3 07/23/2022   MCV 89 07/23/2022   PLT 246 07/23/2022      Component Value Date/Time   NA 137 07/23/2022 1327   K 4.9 07/23/2022 1327   CL 102 07/23/2022 1327   CO2 24 07/23/2022 1327   GLUCOSE 92 07/23/2022 1327   GLUCOSE 116 (H) 07/13/2012 1216   BUN 9 07/23/2022 1327   CREATININE 1.07 (H) 07/23/2022 1327   CALCIUM 9.6 07/23/2022 1327   PROT 6.5 07/23/2022 1327   ALBUMIN 4.8 07/23/2022 1327   AST 14 07/23/2022 1327   ALT 9 07/23/2022 1327   ALKPHOS 76 07/23/2022 1327   BILITOT 0.7 07/23/2022 1327   GFRNONAA 62 12/18/2019 0922   GFRAA 71 12/18/2019 0922   Lab Results  Component Value Date   CHOL 202 (H) 12/17/2020   HDL 73 12/17/2020   LDLCALC 112 (H) 12/17/2020   TRIG 97 12/17/2020   CHOLHDL 2.8 12/17/2020   No results found for: "HGBA1C" Lab Results  Component Value Date   VITAMINB12 256 12/17/2020   Lab Results  Component Value Date   TSH 1.690 12/17/2020    Margie Ege, AGNP-C, DNP 01/21/2023, 1:13 PM [facility] [address], [address] [place], [place] [address] [phone]

## 2023-01-28 ENCOUNTER — Other Ambulatory Visit: Payer: Self-pay

## 2023-01-28 MED ORDER — SODIUM CHLORIDE 0.9 % IV SOLN: 600.0000 mg | INTRAVENOUS | 1 refills | Status: DC

## 2023-01-28 NOTE — Progress Notes (Signed)
I still don't have her IGG level from her labs drawn 01/21/23. We need to know this for Ocrevus.

## 2023-01-29 LAB — IGG, IGA, IGM
IgA/Immunoglobulin A, Serum: 76 mg/dL — ABNORMAL LOW (ref 87–352)
IgG (Immunoglobin G), Serum: 679 mg/dL (ref 586–1602)
IgM (Immunoglobulin M), Srm: 35 mg/dL (ref 26–217)

## 2023-01-29 LAB — CBC WITH DIFFERENTIAL/PLATELET
Basophils Absolute: 0 10*3/uL (ref 0.0–0.2)
Basos: 1 %
EOS (ABSOLUTE): 0.1 10*3/uL (ref 0.0–0.4)
Eos: 1 %
Hematocrit: 38.8 % (ref 34.0–46.6)
Hemoglobin: 12.3 g/dL (ref 11.1–15.9)
Immature Grans (Abs): 0 10*3/uL (ref 0.0–0.1)
Immature Granulocytes: 0 %
Lymphocytes Absolute: 1.3 10*3/uL (ref 0.7–3.1)
Lymphs: 17 %
MCH: 28.2 pg (ref 26.6–33.0)
MCHC: 31.7 g/dL (ref 31.5–35.7)
MCV: 89 fL (ref 79–97)
Monocytes Absolute: 0.6 10*3/uL (ref 0.1–0.9)
Monocytes: 9 %
Neutrophils Absolute: 5.3 10*3/uL (ref 1.4–7.0)
Neutrophils: 72 %
Platelets: 251 10*3/uL (ref 150–450)
RBC: 4.36 x10E6/uL (ref 3.77–5.28)
RDW: 13.1 % (ref 11.7–15.4)
WBC: 7.3 10*3/uL (ref 3.4–10.8)

## 2023-01-29 LAB — COMPREHENSIVE METABOLIC PANEL
ALT: 17 IU/L (ref 0–32)
AST: 14 IU/L (ref 0–40)
Albumin/Globulin Ratio: 2.4 — ABNORMAL HIGH (ref 1.2–2.2)
Albumin: 4.6 g/dL (ref 3.8–4.9)
Alkaline Phosphatase: 71 IU/L (ref 44–121)
BUN/Creatinine Ratio: 10 (ref 9–23)
BUN: 11 mg/dL (ref 6–24)
Bilirubin Total: 0.7 mg/dL (ref 0.0–1.2)
CO2: 23 mmol/L (ref 20–29)
Calcium: 9.2 mg/dL (ref 8.7–10.2)
Chloride: 104 mmol/L (ref 96–106)
Creatinine, Ser: 1.1 mg/dL — ABNORMAL HIGH (ref 0.57–1.00)
Globulin, Total: 1.9 g/dL (ref 1.5–4.5)
Glucose: 84 mg/dL (ref 70–99)
Potassium: 4.4 mmol/L (ref 3.5–5.2)
Sodium: 140 mmol/L (ref 134–144)
Total Protein: 6.5 g/dL (ref 6.0–8.5)
eGFR: 60 mL/min/{1.73_m2} (ref >59)

## 2023-02-03 MED ORDER — SODIUM CHLORIDE 0.9 % IV SOLN: 600.0000 mg | INTRAVENOUS | 1 refills | Status: AC

## 2023-02-03 NOTE — Progress Notes (Signed)
I signed the orders for Ocrevus. Immunoglobulin levels are fine for Ocrevus. Will continue to monitor.

## 2023-02-03 NOTE — Addendum Note (Signed)
Addended by: Glean Salvo on: 02/03/2023 08:56 AM   Modules accepted: Orders

## 2023-02-23 ENCOUNTER — Telehealth: Payer: Self-pay

## 2023-02-23 NOTE — Telephone Encounter (Signed)
Holly from infusion notified us that a new start form is needed because it has been 6 years, I spoke to patient on the phone, she will sign start form on Thursday with her infusion

## 2023-03-22 IMAGING — MG MM DIGITAL SCREENING BILAT W/ TOMO AND CAD
8 series · 8 of 24 positions shown · non-contrast
Comparison: Previous exam(s).

ACR Breast Density Category a: The breast tissue is almost entirely
fatty.

CLINICAL DATA: Screening.

EXAM:
DIGITAL SCREENING BILATERAL MAMMOGRAM WITH TOMOSYNTHESIS AND CAD
TECHNIQUE: Bilateral screening digital craniocaudal and mediolateral oblique
mammograms were obtained. Bilateral screening digital breast
tomosynthesis was performed. The images were evaluated with
computer-aided detection.

[L MLO synth-2D]
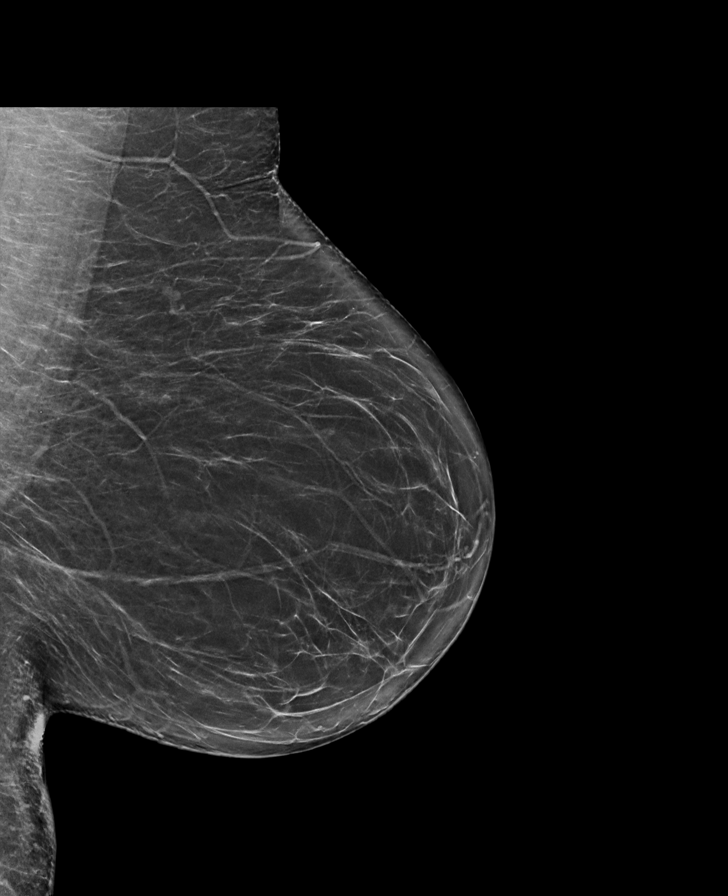

[L CC synth-2D]
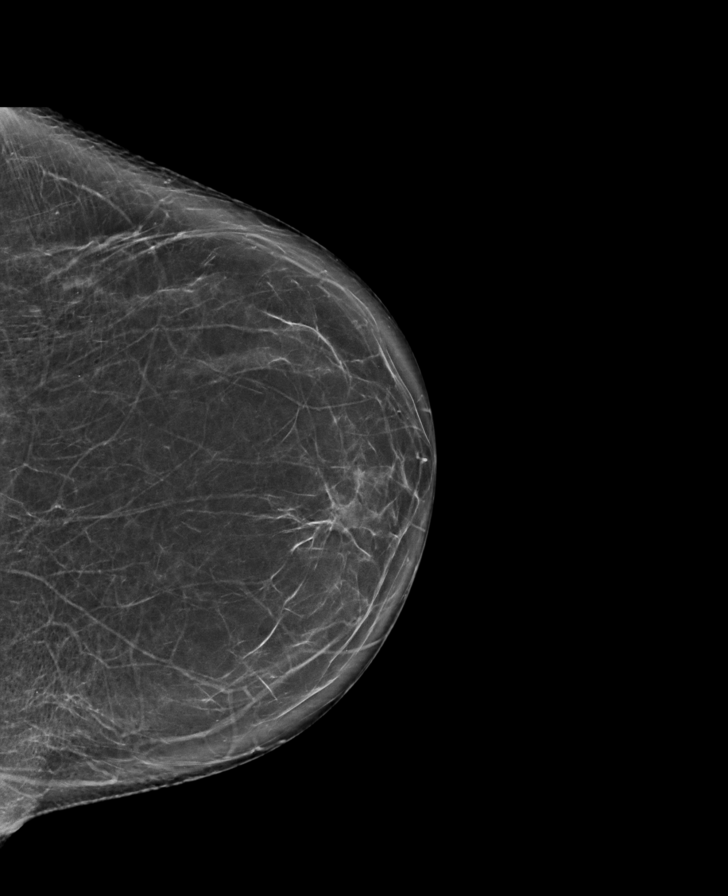

[R MLO synth-2D]
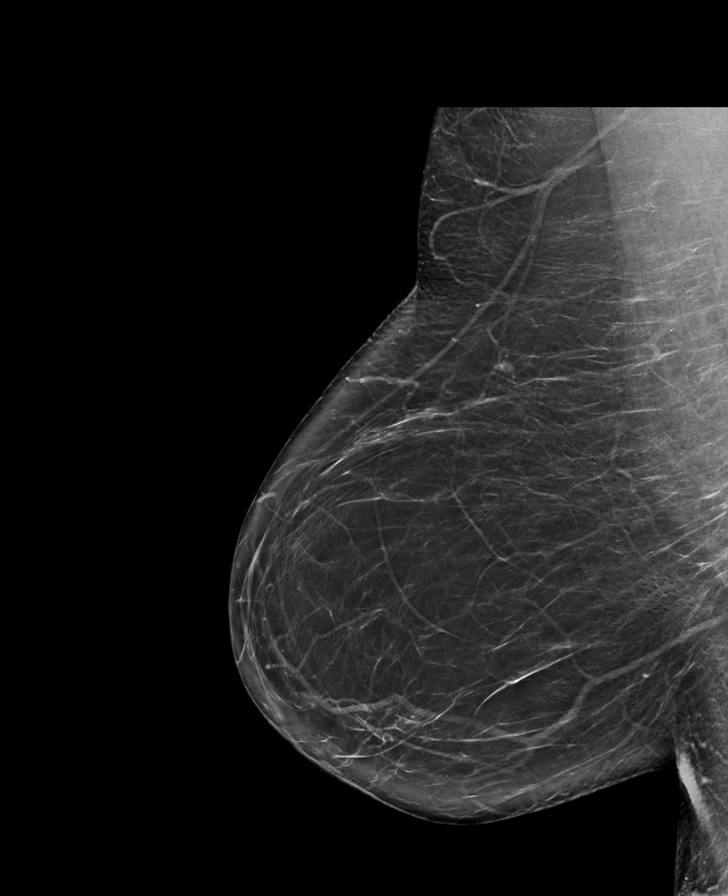

[R CC synth-2D]
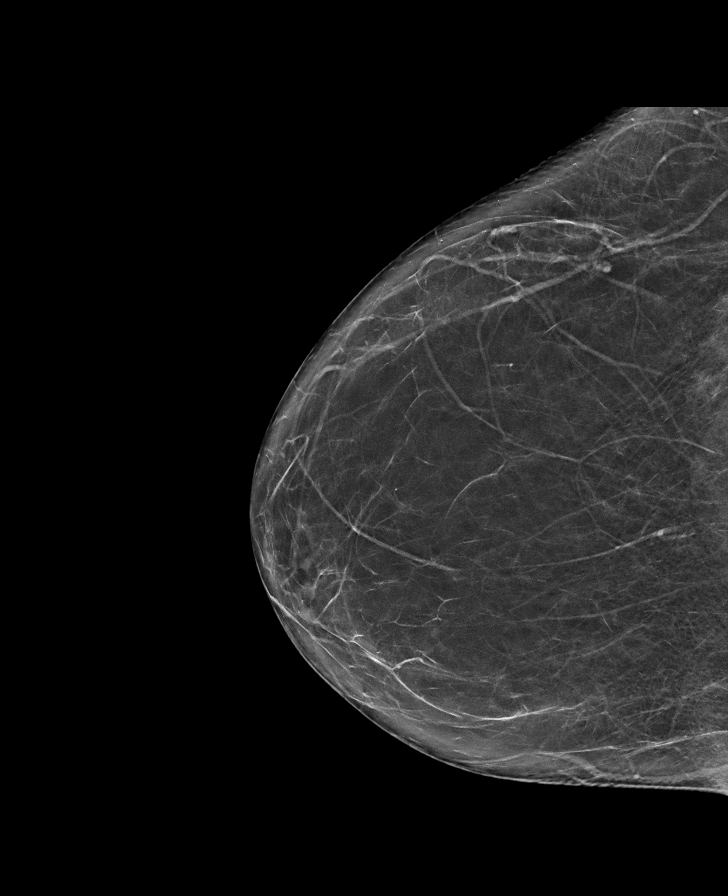

[R MLO tomo · tomo slice 41/82.0]
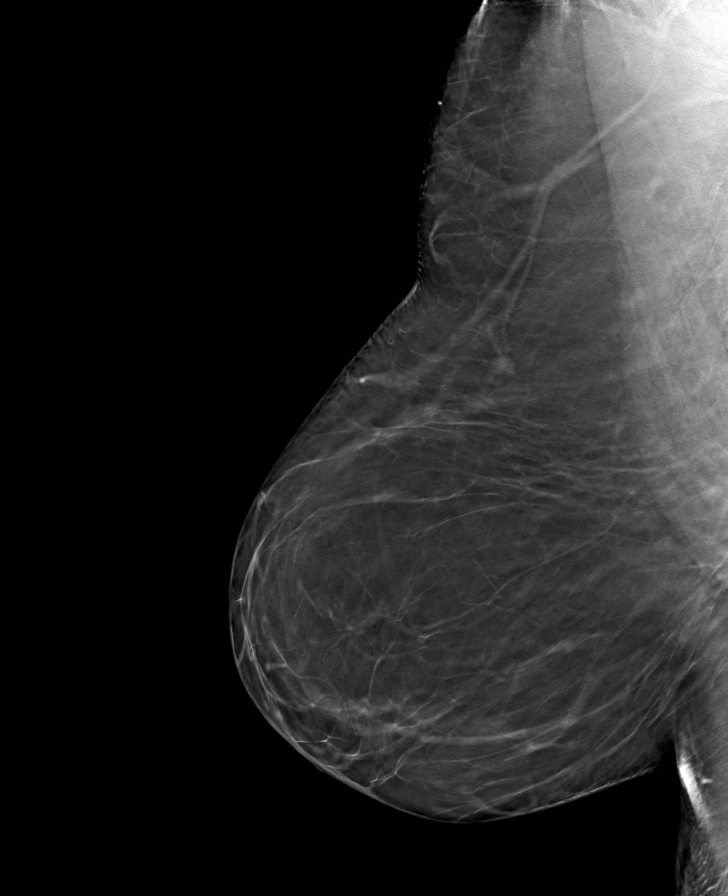

[L CC tomo · tomo slice 39/77.0]
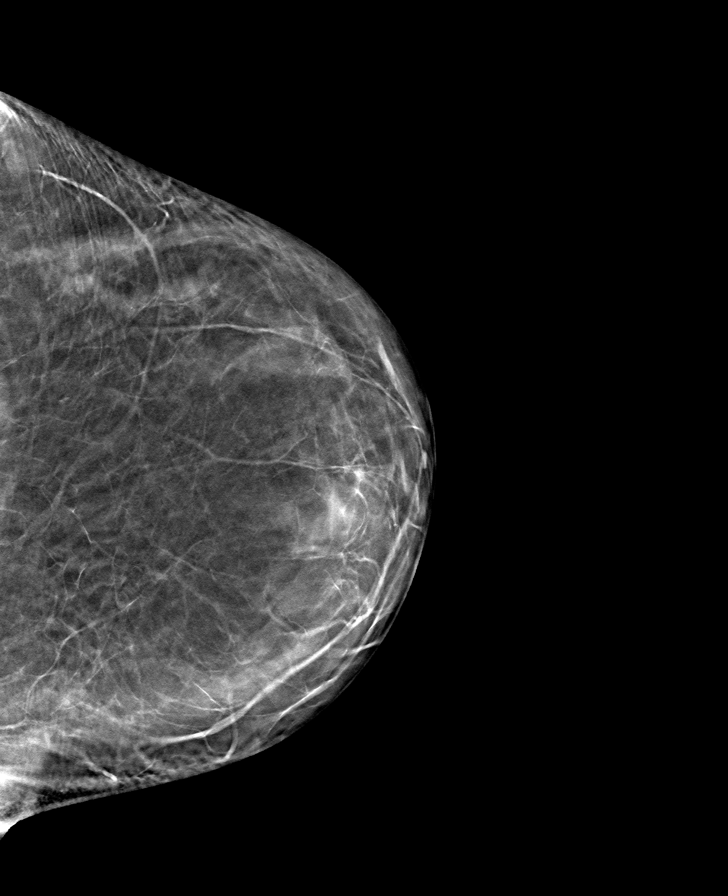

[R CC tomo · tomo slice 37/73.0]
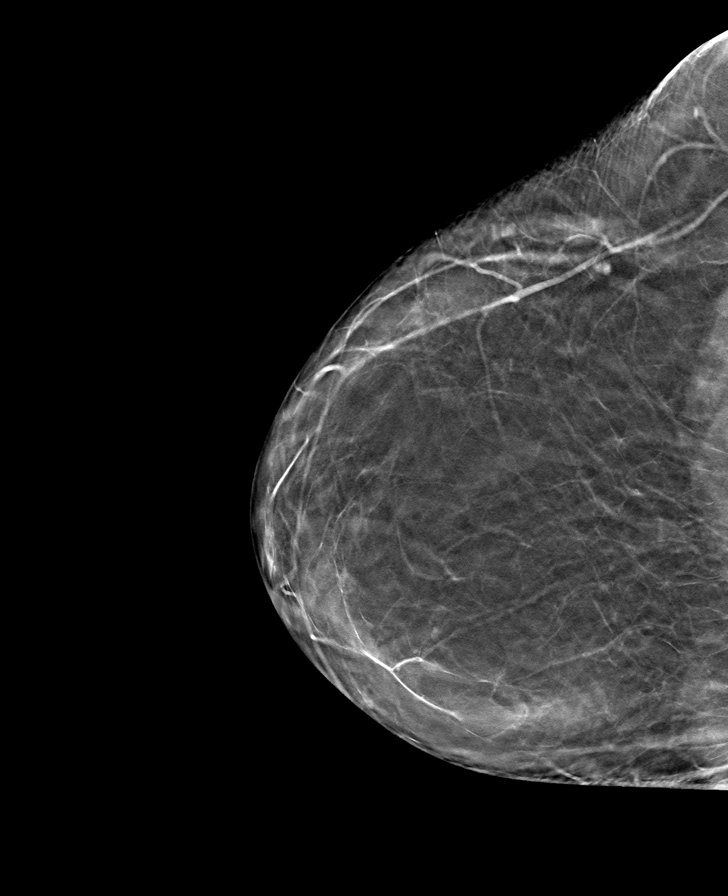

[L MLO tomo · tomo slice 37/74.0]
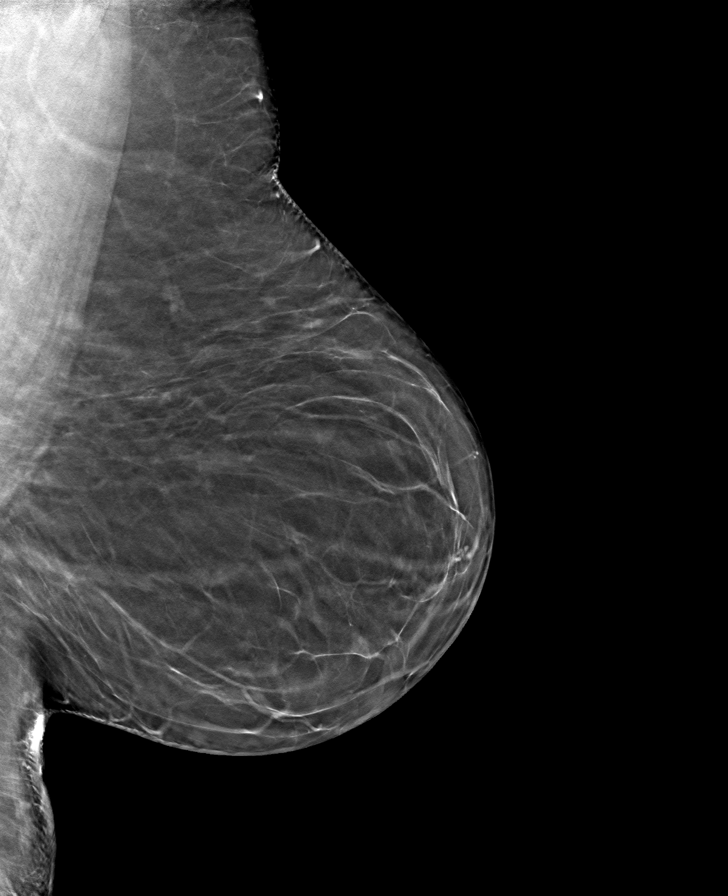

[8 of 24 positions shown; findings below may reference images not displayed]

FINDINGS: There are no findings suspicious for malignancy.
IMPRESSION: No mammographic evidence of malignancy. A result letter of this
screening mammogram will be mailed directly to the patient.

RECOMMENDATION:
Screening mammogram in one year. (Code:0E-3-N98)

BI-RADS CATEGORY  1: Negative.

## 2023-07-12 ENCOUNTER — Encounter (INDEPENDENT_AMBULATORY_CARE_PROVIDER_SITE_OTHER): Payer: Medicare HMO | Admitting: Neurology

## 2023-07-12 ENCOUNTER — Telehealth: Payer: Self-pay

## 2023-07-12 DIAGNOSIS — R238 Other skin changes: Secondary | ICD-10-CM | POA: Insufficient documentation

## 2023-07-12 DIAGNOSIS — R3915 Urgency of urination: Secondary | ICD-10-CM

## 2023-07-12 DIAGNOSIS — R269 Unspecified abnormalities of gait and mobility: Secondary | ICD-10-CM

## 2023-07-12 DIAGNOSIS — G35 Multiple sclerosis: Secondary | ICD-10-CM | POA: Diagnosis not present

## 2023-07-12 MED ORDER — GABAPENTIN 300 MG PO CAPS: 300.0000 mg | ORAL_CAPSULE | Freq: Three times a day (TID) | ORAL | 11 refills | Status: DC

## 2023-07-12 MED ORDER — METHYLPREDNISOLONE 4 MG PO TBPK: ORAL_TABLET | ORAL | 0 refills | Status: DC

## 2023-07-12 MED ORDER — VALACYCLOVIR HCL 1 G PO TABS: 1000.0000 mg | ORAL_TABLET | Freq: Two times a day (BID) | ORAL | 0 refills | Status: DC

## 2023-07-12 NOTE — Telephone Encounter (Signed)
Call to patient who reports the folloing via mychart. " I have an appt with you next week. I started having electric shocks on my left thumb about 3 weeks ago it then moved to under my armpit, almost like spasms. Now over the weekend weakness in left arm, pain and spams in my back left  shoulder. I have been taking advil liquid gels for pain and baclofen for spasms. Do you think I need I just need to continue what I am doing or do I need a steroid for inflammation. I tried to wait it out until I see you next Thursday but the pain is not getting better."    During conversation, patient reports the pain now radiating to left shoulder blade and and swollen in that area. She denies having a rash, shortness of breath, or any vision changes. She does state at times it is painful to take a deep breath. She also reports if she puts 2 fingers a the base of her skull and applies pressure that can temporarily relieve the pain. She reports steroid dose pack has helped in the past. Advised I would send to Dr. Terrace Arabia for review and follow back up. Patient appreciative of call.

## 2023-07-12 NOTE — Telephone Encounter (Signed)
I called patient, she reports 3 weeks history of slow worsening left arm paresthesia, also radiating to left upper posterior thoracic region, skin sensitivity to touch, radiating pain, with the possibility of shingles, she denies a history of shingle infection, did had a vaccine in the past  She also denies any signs of active infection  Last MRI of the brain and cervical spine was in May 2023, extensive cervical cord involvement, due to financial concerns, she wants to hold off repeat MRIs at this point  Will empirically treat her as shingles  Valtrex 1000 mg 3 times a day  Medrol pack tapering dose  Gabapentin 300 mg 3 times daily as needed for neuropathic pain  Meds ordered this encounter  Medications   gabapentin (NEURONTIN) 300 MG capsule    Sig: Take 1 capsule (300 mg total) by mouth 3 (three) times daily.    Dispense:  90 capsule    Refill:  11   valACYclovir (VALTREX) 1000 MG tablet    Sig: Take 1 tablet (1,000 mg total) by mouth 2 (two) times daily.    Dispense:  21 tablet    Refill:  0   methylPREDNISolone (MEDROL DOSEPAK) 4 MG TBPK tablet    Sig: Take as instructed    Dispense:  21 tablet    Refill:  0      Please see the MyChart message reply(ies) for my assessment and plan.    This patient gave consent for this Medical Advice Message and is aware that it may result in a bill to [employer], as well as the possibility of receiving a bill for a co-payment or deductible. They are an established patient, but are not seeking medical advice exclusively about a problem treated during an in person or video visit in the last seven days. I did not recommend an in person or video visit within seven days of my reply.    I spent a total of 10 minutes cumulative time within 7 days through [employer].  Levert Feinstein, MD

## 2023-07-12 NOTE — Addendum Note (Signed)
Addended by: Levert Feinstein on: 07/12/2023 02:51 PM   Modules accepted: Orders

## 2023-07-19 ENCOUNTER — Other Ambulatory Visit: Payer: Self-pay

## 2023-07-19 MED ORDER — OXCARBAZEPINE 150 MG PO TABS: 150.0000 mg | ORAL_TABLET | Freq: Two times a day (BID) | ORAL | 0 refills | Status: DC

## 2023-07-19 MED ORDER — TRAZODONE HCL 100 MG PO TABS: ORAL_TABLET | ORAL | 3 refills | Status: DC

## 2023-07-19 NOTE — Addendum Note (Signed)
Addended by: Levert Feinstein on: 07/19/2023 05:11 PM   Modules accepted: Orders

## 2023-07-22 ENCOUNTER — Encounter: Payer: Self-pay | Admitting: Neurology

## 2023-07-22 ENCOUNTER — Ambulatory Visit: Payer: Medicare HMO | Admitting: Neurology

## 2023-07-22 VITALS — BP 131/76 | HR 70 | Ht 63.0 in | Wt 183.5 lb

## 2023-07-22 DIAGNOSIS — G35 Multiple sclerosis: Secondary | ICD-10-CM | POA: Diagnosis not present

## 2023-07-22 DIAGNOSIS — R252 Cramp and spasm: Secondary | ICD-10-CM

## 2023-07-22 DIAGNOSIS — B0229 Other postherpetic nervous system involvement: Secondary | ICD-10-CM | POA: Insufficient documentation

## 2023-07-22 DIAGNOSIS — F32A Depression, unspecified: Secondary | ICD-10-CM

## 2023-07-22 MED ORDER — VALACYCLOVIR HCL 1 G PO TABS: 1000.0000 mg | ORAL_TABLET | Freq: Three times a day (TID) | ORAL | 0 refills | Status: DC

## 2023-07-22 MED ORDER — DICLOFENAC SODIUM 3 % EX GEL: CUTANEOUS | 11 refills | Status: DC

## 2023-07-22 MED ORDER — GABAPENTIN 300 MG PO CAPS: 600.0000 mg | ORAL_CAPSULE | Freq: Three times a day (TID) | ORAL | 3 refills | Status: DC

## 2023-07-22 MED ORDER — LIDOCAINE-PRILOCAINE 2.5-2.5 % EX CREA: TOPICAL_CREAM | CUTANEOUS | 11 refills | Status: DC

## 2023-07-22 MED ORDER — OXCARBAZEPINE 150 MG PO TABS: 300.0000 mg | ORAL_TABLET | Freq: Two times a day (BID) | ORAL | 11 refills | Status: DC

## 2023-07-22 NOTE — Progress Notes (Signed)
ASSESSMENT AND PLAN 52 y.o. year old female   Relapsing remitting multiple sclerosis -History of flareup in January 2018, June 2018, possibly December 2019 -While on Tysabri worsening gait difficulty, left lower extremity weakness, MRI progression -On Ocrevus since October 2018 -MRI of the brain and cervical spine in May 2023 showed chronic plaques, T1 black holes, hyperintensities at C3-4, C5, C7 likely represent plaques, overall no significant change or new lesions Repeat MRI of the brain and cervical spine with without contrast Laboratory evaluations  Left C7-T4 thoracic shingles since November 2024, postherpetic neuralgia Has completed 1 week of Valtrex, still visible significant rash, neuropathic pain, Extend Valtrex 1 more week, 1000 mg 3 times a day Respond to Trileptal, higher dose 300 mg twice daily, higher dose of gabapentin up to 600 mg 3 times a day, EMLA gel and diclofenac gel  Depression, anxiety Keep Zoloft 100 mg daily, Now on polypharmacy due to recent shingle, postherpetic neuralgia, may consider taper off trazodone if she sleeps well  Bilateral lower extremity spasticity Was on baclofen, may also taper it off,  Urinary urgency Occasionally accident, need diaper  Return To Clinic In 6 Months virtual visit   DIAGNOSTIC DATA (LABS, IMAGING, TESTING) - I reviewed patient records, labs, notes, testing and imaging myself where available.   HISTORY Ms Winberry, 69 -year-old female with history of relapsing remitting multiple sclerosis    In August 2013, she noticed numbness of her left foot, intermittent, faded away in 2 weeks  In September 2013, she noticed left arm numbness, itching underneath her skin, lasting for 2 weeks  In October 2013, she began to notice unbalanced gait, dizziness, progressively worse, woke up one morning noticed double vision, worsening balance difficulty, binocular diplopia, spinning sensation, profound gait difficulty, fatigue, nausea    She was admitted to [facility], July 13 2012 .  MRI brain (MSC) showed numerous white matter lesions which are predominately periventricular location suspicious for chronic MS, with suspected acute demyelinating lesion in the right temporal lobe periventricular optic radiation. Mild premature atrophy. No contrast was given. MRA brain was normal.  [facility], Jul 27, 2012 MRI of the brain showed Multiple chronic periventricular and subcortical chronic demyelinating plaques.There are 3 enhancing lesions likely acute demyelinating plaques in the right periatrial, left posterior frontal and left occipital regions  MRI cervical spine showed Multiple chronic demyelinating plaques at C2-3, C3, C4, C5-6, and C7. No contrast enhancement    Laboratory evaluation showed normal ESR 10, CMP, CBC, negative Lyme titer, acetylcholine receptor antibody, ANA, TSH,    CSF, WBC 3, RBC 12, negative cryptococcal antigen, 5 oligoclonal bands, total protein 29, glucose 65  VEP 07/25/2012: was within normal limits bilaterally. No evidence of conduction slowing was seen within the anterior visual pathways   Tysabri since December 2013.  JC virus antibody was negative in Jul 19, 2102. antibody was intermediate on March 2015. Negative Sep 2015, negative August 23 2014, negative with titer of 0.19 on December 04 2015,  While taking Donnamarie Poag, she had clinical flareup in December 2017, with worsening gait abnormality,    MRI of the brain with and without contrast on September 30 2016 showed multiple hyperintensity foci, 6 of foci enhancement, which was not present on previous MRIs on August 13 2016,   Was switched to ocrelizumab since October 2018, has been doing very well, no clinical flareup  UPDATE Nov 14th 2024: Message office on July 12, 2023 for left chest arm skin sensitivity, consider shingles,  Valtrex was prescribed, later that day, she did have significant rash broke out involving left T4-C7  dermotomes, significant postherpetic neuralgia  She is now on Trileptal 150 mg twice a day has been helpful, higher dose of gabapentin 300 mg 3 times a day, but still complains of 6 out of 10 pain,  Also noticed worsening left arm difficulty gait abnormality, wearing diaper for frequent urinary urgency occasionally incontinence   PHYSICAL EXAM  Vitals:   07/22/23 1320  BP: 131/76  Pulse: 70  Weight: 183 lb 8 oz (83.2 kg)  Height: 5\' 3"  (1.6 m)   Body mass index is 32.51 kg/m.   PHYSICAL EXAMNIATION:  Gen: NAD, conversant, well nourised, well groomed                     Cardiovascular: Regular rate rhythm, no peripheral edema, warm, nontender. Eyes: Conjunctivae clear without exudates or hemorrhage Neck: Supple, no carotid bruits. Pulmonary: Clear to auscultation bilaterally   NEUROLOGICAL EXAM:  MENTAL STATUS: Speech/cognition: Awake, alert oriented to history taking and casual conversation  CRANIAL NERVES: CN II: Visual fields are full to confrontation.  Pupils are round equal and briskly reactive to light. CN III, IV, VI: extraocular movement are normal. No ptosis. CN V: Facial sensation is intact to pinprick in all 3 divisions bilaterally. Corneal responses are intact.  CN VII: Face is symmetric with normal eye closure and smile. CN VIII: Hearing is normal to casual conversation CN IX, X: Palate elevates symmetrically. Phonation is normal. CN XI: Head turning and shoulder shrug are intact CN XII: Tongue is midline with normal movements and no atrophy.  MOTOR: Mild left arm, and left lower extremity weakness  REFLEXES: Reflexes are 3 and symmetric at the biceps, triceps, knees,    SENSORY: Rash broke out noted at left T4-C7 dermatome, allodynia  COORDINATION: Rapid alternating movements and fine finger movements are intact. There is no dysmetria on finger-to-nose and heel-knee-shin.    GAIT/STANCE: Left from seated position, dragging left leg, cautious,    REVIEW OF SYSTEMS: Out of a complete 14 system review of symptoms, the patient complains only of the following symptoms, and all other reviewed systems are negative.  See HPI  ALLERGIES: No Known Allergies  HOME MEDICATIONS: Outpatient Medications Prior to Visit  Medication Sig Dispense Refill   atorvastatin (LIPITOR) 20 MG tablet 20 mg.     baclofen (LIORESAL) 10 MG tablet Take 1.5 tablets (15 mg total) by mouth at bedtime. 135 each 3   Cyanocobalamin (B12 LIQUID HEALTH BOOSTER PO) Take by mouth daily.     estradiol (ESTRACE) 2 MG tablet Take 2 mg by mouth daily.     gabapentin (NEURONTIN) 300 MG capsule Take 1 capsule (300 mg total) by mouth 3 (three) times daily. 90 capsule 11   ibuprofen (ADVIL,MOTRIN) 200 MG tablet Take 400 mg by mouth every 6 (six) hours as needed. For pain     MAGNESIUM PO Take 1 tablet by mouth at bedtime.     methylPREDNISolone (MEDROL DOSEPAK) 4 MG TBPK tablet Take as instructed 21 tablet 0   ocrelizumab 600 mg in sodium chloride 0.9 % 500 mL Inject 600 mg into the vein every 6 (six) months. 1 each 1   OXcarbazepine (TRILEPTAL) 150 MG tablet Take 1 tablet (150 mg total) by mouth 2 (two) times daily. 60 tablet 0   sertraline (ZOLOFT) 100 MG tablet TAKE 1 TABLET(100 MG) BY MOUTH DAILY 90 tablet 3   traZODone (DESYREL) 100 MG  tablet TAKE 1 TABLET(100 MG) BY MOUTH AT BEDTIME 90 tablet 3   valACYclovir (VALTREX) 1000 MG tablet Take 1 tablet (1,000 mg total) by mouth 2 (two) times daily. 21 tablet 0   No facility-administered medications prior to visit.    PAST MEDICAL HISTORY: Past Medical History:  Diagnosis Date   MS (multiple sclerosis) (HCC) 07/13/12   Uterine cancer (HCC) 2000    PAST SURGICAL HISTORY: Past Surgical History:  Procedure Laterality Date   ABDOMINAL HYSTERECTOMY  2000    FAMILY HISTORY: Family History  Problem Relation Age of Onset   Lung cancer Father    Breast cancer Neg Hx     SOCIAL HISTORY: Social History   Socioeconomic  History   Marital status: Married    Spouse name: Not on file   Number of children: 2   Years of education: 14   Highest education level: Not on file  Occupational History   Occupation: Psychologist, sport and exercise: [employer]  Tobacco Use   Smoking status: Never   Smokeless tobacco: Never  Substance and Sexual Activity   Alcohol use: No   Drug use: No   Sexual activity: Yes    Birth control/protection: None  Other Topics Concern   Not on file  Social History Narrative   Patient lives at home with her daughter. Patient has a two years of college education.   Right handed.   Caffeine- two sodas daily.   Patient is separated.    Social Determinants of Health   Financial Resource Strain: Low Risk  (08/04/2022)   Received from [facility], [facility]   Overall Financial Resource Strain (CARDIA)    Difficulty of Paying Living Expenses: Not hard at all  Food Insecurity: No Food Insecurity (08/04/2022)   Received from [facility], [facility]   Hunger Vital Sign    Worried About Running Out of Food in the Last Year: Never true    Ran Out of Food in the Last Year: Never true  Transportation Needs: No Transportation Needs (08/04/2022)   Received from [facility], [facility]   [facility] - Transportation    Lack of Transportation (Medical): No    Lack of Transportation (Non-Medical): No  Physical Activity: Not on file  Stress: No Stress Concern Present (08/04/2022)   Received from [facility], [facility]   [facility] - Occupational Stress Questionnaire    Feeling of Stress : Not at all  Social Connections: Not on file  Intimate Partner Violence: Not on file   Levert Feinstein, M.D. Ph.D.  [facility] [address] [place], [place] [address] Phone: [phone] Fax:      [phone]   Total time spent reviewing the chart, obtaining history, examined patient, ordering tests, documentation, consultations and family,  care coordination was  56 minutes

## 2023-07-24 LAB — COMPREHENSIVE METABOLIC PANEL
ALT: 16 [IU]/L (ref 0–32)
AST: 18 IU/L (ref 0–40)
Albumin: 4.3 g/dL (ref 3.8–4.9)
Alkaline Phosphatase: 63 IU/L (ref 44–121)
BUN/Creatinine Ratio: 13 (ref 9–23)
BUN: 12 mg/dL (ref 6–24)
Bilirubin Total: 0.6 mg/dL (ref 0.0–1.2)
CO2: 25 mmol/L (ref 20–29)
Calcium: 9.3 mg/dL (ref 8.7–10.2)
Chloride: 102 mmol/L (ref 96–106)
Creatinine, Ser: 0.94 mg/dL (ref 0.57–1.00)
Globulin, Total: 2 g/dL (ref 1.5–4.5)
Glucose: 85 mg/dL (ref 70–99)
Potassium: 4.6 mmol/L (ref 3.5–5.2)
Sodium: 138 mmol/L (ref 134–144)
Total Protein: 6.3 g/dL (ref 6.0–8.5)
eGFR: 73 mL/min/{1.73_m2} (ref >59)

## 2023-07-24 LAB — IGG, IGA, IGM
IgA/Immunoglobulin A, Serum: 75 mg/dL — ABNORMAL LOW (ref 87–352)
IgG (Immunoglobin G), Serum: 690 mg/dL (ref 586–1602)
IgM (Immunoglobulin M), Srm: 32 mg/dL (ref 26–217)

## 2023-07-24 LAB — CBC WITH DIFFERENTIAL/PLATELET
Basophils Absolute: 0 10*3/uL (ref 0.0–0.2)
Basos: 0 %
EOS (ABSOLUTE): 0.1 10*3/uL (ref 0.0–0.4)
Eos: 1 %
Hematocrit: 38.6 % (ref 34.0–46.6)
Hemoglobin: 12.5 g/dL (ref 11.1–15.9)
Immature Grans (Abs): 0.1 10*3/uL (ref 0.0–0.1)
Immature Granulocytes: 1 %
Lymphocytes Absolute: 2 10*3/uL (ref 0.7–3.1)
Lymphs: 21 %
MCH: 30 pg (ref 26.6–33.0)
MCHC: 32.4 g/dL (ref 31.5–35.7)
MCV: 93 fL (ref 79–97)
Monocytes Absolute: 0.9 10*3/uL (ref 0.1–0.9)
Monocytes: 9 %
Neutrophils Absolute: 6.5 10*3/uL (ref 1.4–7.0)
Neutrophils: 68 %
Platelets: 269 10*3/uL (ref 150–450)
RBC: 4.17 x10E6/uL (ref 3.77–5.28)
RDW: 13.2 % (ref 11.7–15.4)
WBC: 9.6 10*3/uL (ref 3.4–10.8)

## 2023-07-24 LAB — VITAMIN D 25 HYDROXY (VIT D DEFICIENCY, FRACTURES): Vit D, 25-Hydroxy: 33.3 ng/mL (ref 30.0–100.0)

## 2023-07-24 LAB — CD20 B CELLS
% CD19-B Cells: 0 % — ABNORMAL LOW (ref 4.6–22.1)
% CD20-B Cells: 0 % — ABNORMAL LOW (ref 5.0–22.3)

## 2023-07-24 LAB — TSH: TSH: 2.02 u[IU]/mL (ref 0.450–4.500)

## 2023-07-29 ENCOUNTER — Telehealth: Payer: Self-pay | Admitting: Neurology

## 2023-07-29 NOTE — Telephone Encounter (Signed)
Ethlyn Gallery: [phone] exp. 07/29/23-09/27/23 sent to GI [phone]

## 2023-08-12 ENCOUNTER — Other Ambulatory Visit: Payer: Self-pay

## 2023-08-12 MED ORDER — SERTRALINE HCL 100 MG PO TABS: ORAL_TABLET | ORAL | 3 refills | Status: DC

## 2023-08-24 ENCOUNTER — Telehealth: Payer: Self-pay

## 2023-08-24 ENCOUNTER — Other Ambulatory Visit (HOSPITAL_COMMUNITY): Payer: Self-pay

## 2023-08-24 NOTE — Telephone Encounter (Signed)
Pharmacy Patient Advocate Encounter   Received notification from CoverMyMeds that prior authorization for Diclofenac Sodium 3% gel is required/requested.   Insurance verification completed.   The patient is insured through [place] .   Per test claim: PA required; PA submitted to above mentioned insurance via CoverMyMeds Key/confirmation #/EOC [id number] Status is pending

## 2023-08-26 NOTE — Telephone Encounter (Signed)
Pharmacy Patient Advocate Encounter  Received notification from [facility] that Prior Authorization for Diclofenac Sodium 3% gel has been DENIED.  Full denial letter will be uploaded to the media tab. See denial reason below.   PA #/Case ID/Reference #: PA Case ID #: [id number]

## 2023-09-14 ENCOUNTER — Ambulatory Visit
Admission: RE | Admit: 2023-09-14 | Discharge: 2023-09-14 | Disposition: A | Discharge status: Home / Self Care | Payer: Medicare HMO | Source: Ambulatory Visit | Attending: Neurology | Admitting: Neurology

## 2023-09-14 DIAGNOSIS — G35 Multiple sclerosis: Secondary | ICD-10-CM | POA: Diagnosis not present

## 2023-09-14 DIAGNOSIS — R252 Cramp and spasm: Secondary | ICD-10-CM

## 2023-09-14 DIAGNOSIS — F32A Depression, unspecified: Secondary | ICD-10-CM

## 2023-09-14 DIAGNOSIS — B0229 Other postherpetic nervous system involvement: Secondary | ICD-10-CM

## 2023-09-14 MED ORDER — GADOPICLENOL 0.5 MMOL/ML IV SOLN
7.5000 mL | Freq: Once | INTRAVENOUS | Status: AC | PRN
  Administered 2023-09-14: 7.5 mL via INTRAVENOUS

## 2024-01-20 ENCOUNTER — Telehealth (INDEPENDENT_AMBULATORY_CARE_PROVIDER_SITE_OTHER): Payer: Medicare HMO | Admitting: Neurology

## 2024-01-20 DIAGNOSIS — R269 Unspecified abnormalities of gait and mobility: Secondary | ICD-10-CM | POA: Diagnosis not present

## 2024-01-20 DIAGNOSIS — G35 Multiple sclerosis: Secondary | ICD-10-CM | POA: Diagnosis not present

## 2024-01-20 DIAGNOSIS — F419 Anxiety disorder, unspecified: Secondary | ICD-10-CM

## 2024-01-20 DIAGNOSIS — F32A Depression, unspecified: Secondary | ICD-10-CM

## 2024-01-20 DIAGNOSIS — F5104 Psychophysiologic insomnia: Secondary | ICD-10-CM

## 2024-01-20 DIAGNOSIS — B0229 Other postherpetic nervous system involvement: Secondary | ICD-10-CM

## 2024-01-20 DIAGNOSIS — R3915 Urgency of urination: Secondary | ICD-10-CM

## 2024-01-20 DIAGNOSIS — G35A Relapsing-remitting multiple sclerosis: Secondary | ICD-10-CM

## 2024-01-20 MED ORDER — OXYBUTYNIN CHLORIDE ER 10 MG PO TB24: 10.0000 mg | ORAL_TABLET | Freq: Every day | ORAL | 11 refills | Status: DC

## 2024-01-20 MED ORDER — DULOXETINE HCL 30 MG PO CPEP: 30.0000 mg | ORAL_CAPSULE | Freq: Every day | ORAL | 3 refills | Status: DC

## 2024-01-20 MED ORDER — TRAZODONE HCL 100 MG PO TABS: ORAL_TABLET | ORAL | 3 refills | Status: DC

## 2024-01-20 NOTE — Progress Notes (Signed)
 ASSESSMENT AND PLAN 53 y.o. year old female   Relapsing remitting multiple sclerosis -History of flareup in January 2018, June 2018, possibly December 2019 -While on Tysabri  worsening gait difficulty, left lower extremity weakness, MRI progression -On Ocrevus  since October 2018 Reviewed MRI of the brain and cervical spine in January 2025, overall no significant change compared to previous MRI, to have ill-defined cervical cord involvement C3-4, C5 and C7  -Continue all concizumab infusion  Left C7-T4 thoracic shingles since November 2024, postherpetic neuralgia Much improved, has been off gabapentin  Trileptal   Depression, anxiety, chronic insomnia Suboptimal control with Zoloft  100 mg Decided to taper off Zoloft , add on Cymbalta 30 mg daily,   Urinary urgency Oxybutynin XR 10 mg every night  Call clinic in 2 weeks for progress report, return to clinic with nurse practitioner in 6 months   DIAGNOSTIC DATA (LABS, IMAGING, TESTING) - I reviewed patient records, labs, notes, testing and imaging myself where available.   HISTORY [name], 45 -year-old female with history of relapsing remitting multiple sclerosis    In August 2013, she noticed numbness of her left foot, intermittent, faded away in 2 weeks  In September 2013, she noticed left arm numbness, itching underneath her skin, lasting for 2 weeks  In October 2013, she began to notice unbalanced gait, dizziness, progressively worse, woke up one morning noticed double vision, worsening balance difficulty, binocular diplopia, spinning sensation, profound gait difficulty, fatigue, nausea   She was admitted to [facility], July 13 2012 .  MRI brain (MSC) showed numerous white matter lesions which are predominately periventricular location suspicious for chronic MS, with suspected acute demyelinating lesion in the right temporal lobe periventricular optic radiation. Mild premature atrophy. No contrast was given. MRA  brain was normal.  [facility], Jul 27, 2012 MRI of the brain showed Multiple chronic periventricular and subcortical chronic demyelinating plaques.There are 3 enhancing lesions likely acute demyelinating plaques in the right periatrial, left posterior frontal and left occipital regions  MRI cervical spine showed Multiple chronic demyelinating plaques at C2-3, C3, C4, C5-6, and C7. No contrast enhancement    Laboratory evaluation showed normal ESR 10, CMP, CBC, negative Lyme titer, acetylcholine receptor antibody, ANA, TSH,    CSF, WBC 3, RBC 12, negative cryptococcal antigen, 5 oligoclonal bands, total protein 29, glucose 65  VEP 07/25/2012: was within normal limits bilaterally. No evidence of conduction slowing was seen within the anterior visual pathways   Tysabri  since December 2013.  JC virus antibody was negative in Jul 19, 2102. antibody was intermediate on March 2015. Negative Sep 2015, negative August 23 2014, negative with titer of 0.19 on December 04 2015,  While taking Tysabri , she had clinical flareup in December 2017, with worsening gait abnormality,    MRI of the brain with and without contrast on September 30 2016 showed multiple hyperintensity foci, 6 of foci enhancement, which was not present on previous MRIs on August 13 2016,   Was switched to ocrelizumab  since October 2018, has been doing very well, no clinical flareup  UPDATE Nov 14th 2024: Message office on July 12, 2023 for left chest arm skin sensitivity, consider shingles, Valtrex  was prescribed, later that day, she did have significant rash broke out involving left T4-C7 dermotomes, significant postherpetic neuralgia  She is now on Trileptal  150 mg twice a day has been helpful, higher dose of gabapentin  300 mg 3 times a day, but still complains of 6 out of 10 pain,  Also noticed worsening  left arm difficulty gait abnormality, wearing diaper for frequent urinary urgency occasionally incontinence   Virtual Visit  via video UPDATE May 15th 2025 I discussed the limitations of evaluation and management by telemedicine and the availability of in person appointments. The patient expressed understanding and agreed to proceed  Location: Provider: [facility] office; Patient: Home  [name]  on May 15th 2025 by a video enabled telemedicine application and verified that I am speaking with the correct person using two identifiers.  UPDATED HiSTORY  She recovered well from her left thoracic shingles, lasted from Nov 24 to Feb 2025, no longer has neuropathic pain,  She denies MS flareup, tolerating ocrelizumab  infusion,  Worsening urinary urgency, frequent incontinence, have to wear a diaper,  Recently had tooth abscess, had multiple dental work, prolonged antibiotic, noticed worsening emotion, anxiety, has been on Zoloft  100 mg for a long time, seems to lose its benefit, still required trazodone  every night to sleep   Observations/Objective: I have reviewed problem lists, medications, allergies. Awake, alert, oriented to history taking, casual conversation,     REVIEW OF SYSTEMS: Out of a complete 14 system review of symptoms, the patient complains only of the following symptoms, and all other reviewed systems are negative.  See HPI  ALLERGIES: No Known Allergies  HOME MEDICATIONS: Outpatient Medications Prior to Visit  Medication Sig Dispense Refill   atorvastatin (LIPITOR) 20 MG tablet 20 mg.     baclofen  (LIORESAL ) 10 MG tablet Take 1.5 tablets (15 mg total) by mouth at bedtime. 135 each 3   Cyanocobalamin (B12 LIQUID HEALTH BOOSTER PO) Take by mouth daily.     Diclofenac  Sodium 3 % GEL 2 gram qid prn 100 g 11   estradiol (ESTRACE) 2 MG tablet Take 2 mg by mouth daily.     gabapentin  (NEURONTIN ) 300 MG capsule Take 2 capsules (600 mg total) by mouth 3 (three) times daily. 540 capsule 3   ibuprofen (ADVIL,MOTRIN) 200 MG tablet Take 400 mg by mouth every 6 (six) hours as needed. For  pain     lidocaine -prilocaine  (EMLA ) cream 1gram qid prn 30 g 11   MAGNESIUM PO Take 1 tablet by mouth at bedtime.     methylPREDNISolone  (MEDROL  DOSEPAK) 4 MG TBPK tablet Take as instructed 21 tablet 0   ocrelizumab  600 mg in sodium chloride  0.9 % 500 mL Inject 600 mg into the vein every 6 (six) months. 1 each 1   OXcarbazepine  (TRILEPTAL ) 150 MG tablet Take 2 tablets (300 mg total) by mouth 2 (two) times daily. 120 tablet 11   sertraline  (ZOLOFT ) 100 MG tablet TAKE 1 TABLET(100 MG) BY MOUTH DAILY 90 tablet 3   traZODone  (DESYREL ) 100 MG tablet TAKE 1 TABLET(100 MG) BY MOUTH AT BEDTIME 90 tablet 3   valACYclovir  (VALTREX ) 1000 MG tablet Take 1 tablet (1,000 mg total) by mouth 3 (three) times daily. 21 tablet 0   No facility-administered medications prior to visit.    PAST MEDICAL HISTORY: Past Medical History:  Diagnosis Date   MS (multiple sclerosis) (HCC) 07/13/12   Uterine cancer (HCC) 2000    PAST SURGICAL HISTORY: Past Surgical History:  Procedure Laterality Date   ABDOMINAL HYSTERECTOMY  2000    FAMILY HISTORY: Family History  Problem Relation Age of Onset   Lung cancer Father    Breast cancer Neg Hx     SOCIAL HISTORY: Social History   Socioeconomic History   Marital status: Married    Spouse name: Not on  file   Number of children: 2   Years of education: 14   Highest education level: Not on file  Occupational History   Occupation: banker    Employer: [employer]  Tobacco Use   Smoking status: Never   Smokeless tobacco: Never  Substance and Sexual Activity   Alcohol use: No   Drug use: No   Sexual activity: Yes    Birth control/protection: None  Other Topics Concern   Not on file  Social History Narrative   Patient lives at home with her daughter. Patient has a two years of college education.   Right handed.   Caffeine- two sodas daily.   Patient is separated.    Social Drivers of Corporate investment banker Strain: Low Risk  (08/04/2022)   Received  from [facility], [facility]   Overall Financial Resource Strain (CARDIA)    Difficulty of Paying Living Expenses: Not hard at all  Food Insecurity: No Food Insecurity (08/04/2022)   Received from [facility], [facility]   Hunger Vital Sign    Worried About Running Out of Food in the Last Year: Never true    Ran Out of Food in the Last Year: Never true  Transportation Needs: No Transportation Needs (08/04/2022)   Received from [facility], [facility]   [facility] - Transportation    Lack of Transportation (Medical): No    Lack of Transportation (Non-Medical): No  Physical Activity: Not on file  Stress: No Stress Concern Present (08/04/2022)   Received from [facility], [facility]   [facility] of Occupational Health - Occupational Stress Questionnaire    Feeling of Stress : Not at all  Social Connections: Not on file  Intimate Partner Violence: Not on file   Phebe Brasil, M.D. Ph.D.  [facility] [address] [place], [place] [address] Phone: [phone] Fax:      [phone]

## 2024-02-03 ENCOUNTER — Telehealth: Payer: Self-pay | Admitting: Neurology

## 2024-02-03 ENCOUNTER — Encounter: Payer: Self-pay | Admitting: Neurology

## 2024-02-03 NOTE — Telephone Encounter (Signed)
 Suzanne Robbins

## 2024-02-24 ENCOUNTER — Telehealth: Payer: Self-pay | Admitting: Neurology

## 2024-02-24 MED ORDER — DULOXETINE HCL 60 MG PO CPEP: 60.0000 mg | ORAL_CAPSULE | Freq: Every day | ORAL | 3 refills | Status: DC

## 2024-02-24 MED ORDER — OXYBUTYNIN CHLORIDE ER 10 MG PO TB24: 10.0000 mg | ORAL_TABLET | Freq: Every day | ORAL | 3 refills | Status: DC

## 2024-02-24 NOTE — Telephone Encounter (Signed)
 Meds ordered this encounter  Medications   DULoxetine  (CYMBALTA ) 60 MG capsule    Sig: Take 1 capsule (60 mg total) by mouth daily.    Dispense:  90 capsule    Refill:  3   oxybutynin  (DITROPAN -XL) 10 MG 24 hr tablet    Sig: Take 1 tablet (10 mg total) by mouth at bedtime.    Dispense:  90 tablet    Refill:  3       Oxybutynin  10 mg every night Has helped her urinary urgency, Cymbalta  30 mg is helpful, but still has mild anxiety, will titrate to 60 mg daily

## 2024-03-02 ENCOUNTER — Telehealth: Payer: Self-pay

## 2024-04-03 ENCOUNTER — Other Ambulatory Visit: Payer: Self-pay

## 2024-04-03 MED ORDER — TRAZODONE HCL 100 MG PO TABS: ORAL_TABLET | ORAL | 3 refills | Status: AC

## 2024-08-22 ENCOUNTER — Ambulatory Visit: Admitting: Neurology

## 2024-08-22 ENCOUNTER — Encounter: Payer: Self-pay | Admitting: Neurology

## 2024-08-22 VITALS — BP 122/80 | HR 71 | Ht 63.0 in | Wt 187.4 lb

## 2024-08-22 DIAGNOSIS — B0229 Other postherpetic nervous system involvement: Secondary | ICD-10-CM

## 2024-08-22 DIAGNOSIS — G35A Relapsing-remitting multiple sclerosis: Secondary | ICD-10-CM | POA: Diagnosis not present

## 2024-08-22 DIAGNOSIS — F32A Depression, unspecified: Secondary | ICD-10-CM

## 2024-08-22 DIAGNOSIS — R3915 Urgency of urination: Secondary | ICD-10-CM

## 2024-08-22 MED ORDER — OXYBUTYNIN CHLORIDE ER 10 MG PO TB24: 10.0000 mg | ORAL_TABLET | Freq: Every day | ORAL | 3 refills | Status: AC

## 2024-08-22 MED ORDER — DULOXETINE HCL 60 MG PO CPEP: 60.0000 mg | ORAL_CAPSULE | Freq: Every day | ORAL | 3 refills | Status: AC

## 2024-08-22 MED ORDER — GABAPENTIN 300 MG PO CAPS: 300.0000 mg | ORAL_CAPSULE | Freq: Three times a day (TID) | ORAL | 3 refills | Status: AC

## 2024-08-22 NOTE — Progress Notes (Signed)
 ASSESSMENT AND PLAN 53 y.o. year old female   Relapsing remitting multiple sclerosis High risk medication management -History of flareup in January 2018, June 2018, possibly December 2019 -While on Tysabri , but continue to have worsening gait difficulty, left lower extremity weakness, MRI progression -On Ocrevus  since October 2018 -MRI of the brain and cervical spine with without contrast was stable, evidence of cervical cord involvement -Continue infusion, laboratory evaluations  Left C7-T4 thoracic shingles in November 2024, postherpetic neuralgia Gabapentin  300 mg 3 times daily as needed  Depression, anxiety Doing better with Cymbalta  60 mg daily  Bilateral lower extremity spasticity with gait abnormality Encouraged her moderate exercise  Urinary urgency Oxybutynin  ER 10 mg daily    Return To Clinic In 6 Months   DIAGNOSTIC DATA (LABS, IMAGING, TESTING) - I reviewed patient records, labs, notes, testing and imaging myself where available.   HISTORY Suzanne Robbins, 65 -year-old female with history of relapsing remitting multiple sclerosis    In August 2013, she noticed numbness of her left foot, intermittent, faded away in 2 weeks  In September 2013, she noticed left arm numbness, itching underneath her skin, lasting for 2 weeks  In October 2013, she began to notice unbalanced gait, dizziness, progressively worse, woke up one morning noticed double vision, worsening balance difficulty, binocular diplopia, spinning sensation, profound gait difficulty, fatigue, nausea   She was admitted to [facility], July 13 2012 .  MRI brain ([facility]) showed numerous white matter lesions which are predominately periventricular location suspicious for chronic Suzanne, with suspected acute demyelinating lesion in the right temporal lobe periventricular optic radiation. Mild premature atrophy. No contrast was given. MRA brain was normal.  [facility], Jul 27, 2012 MRI of the brain showed  Multiple chronic periventricular and subcortical chronic demyelinating plaques.There are 3 enhancing lesions likely acute demyelinating plaques in the right periatrial, left posterior frontal and left occipital regions  MRI cervical spine showed Multiple chronic demyelinating plaques at C2-3, C3, C4, C5-6, and C7. No contrast enhancement    Laboratory evaluation showed normal ESR 10, CMP, CBC, negative Lyme titer, acetylcholine receptor antibody, ANA, TSH,    CSF, WBC 3, RBC 12, negative cryptococcal antigen, 5 oligoclonal bands, total protein 29, glucose 65  VEP 07/25/2012: was within normal limits bilaterally. No evidence of conduction slowing was seen within the anterior visual pathways   Tysabri  since December 2013.  JC virus antibody was negative in Jul 19, 2102. antibody was intermediate on March 2015. Negative Sep 2015, negative August 23 2014, negative with titer of 0.19 on December 04 2015,  While taking Tysabri , she had clinical flareup in December 2017, with worsening gait abnormality,    MRI of the brain with and without contrast on September 30 2016 showed multiple hyperintensity foci, 6 of foci enhancement, which was not present on previous MRIs on August 13 2016,   Was switched to ocrelizumab  since October 2018, has been doing very well, no clinical flareup  UPDATE Nov 14th 2024: Message office on July 12, 2023 for left chest arm skin sensitivity, consider shingles, Valtrex  was prescribed, later that day, she did have significant rash broke out involving left T4-C7 dermotomes, significant postherpetic neuralgia  She is now on Trileptal  150 mg twice a day has been helpful, higher dose of gabapentin  300 mg 3 times a day, but still complains of 6 out of 10 pain,  Also noticed worsening left arm difficulty gait abnormality, wearing diaper for frequent urinary urgency occasionally incontinence  Virtual  visit May 15th 2025: She recovered well from her left thoracic shingles, lasted  from Nov 24 to Feb 2025, no longer has neuropathic pain,  She denies Suzanne flareup, tolerating ocrelizumab  infusion,  Worsening urinary urgency, frequent incontinence, have to wear a diaper,  Recently had tooth abscess, had multiple dental work, prolonged antibiotic, noticed worsening emotion, anxiety, has been on Zoloft  100 mg for a long time, seems to lose its benefit, still required trazodone  every night to sleep  UPDATE Dec 16th 2025: She had recurrent neuropathic pain at previous left thoracic shingle territory, gabapentin  as needed was helpful, higher dose of Cymbalta  60 mg has helped her depression anxiety, oxybutynin  works well for her urinary urgency, Reviewed most recent MRI brain in January 2025, overall no change, evidence of cervical cord involvement, ill-defined cord abnormality at C3-4, C5 7, multilevel degenerative changes,  PHYSICAL EXAM  Vitals:   07/22/23 1320  BP: 131/76  Pulse: 70  Weight: 183 lb 8 oz (83.2 kg)  Height: 5' 3 (1.6 m)   Body mass index is 32.51 kg/m.   PHYSICAL EXAMNIATION:  Gen: NAD, conversant, well nourised, well groomed                     Cardiovascular: Regular rate rhythm, no peripheral edema, warm, nontender. Eyes: Conjunctivae clear without exudates or hemorrhage Neck: Supple, no carotid bruits. Pulmonary: Clear to auscultation bilaterally   NEUROLOGICAL EXAM:  MENTAL STATUS: Speech/cognition: Awake, alert oriented to history taking and casual conversation  CRANIAL NERVES: CN II: Visual fields are full to confrontation.  Pupils are round equal and briskly reactive to light. CN III, IV, VI: extraocular movement are normal. No ptosis. CN V: Facial sensation is intact to pinprick in all 3 divisions bilaterally. Corneal responses are intact.  CN VII: Face is symmetric with normal eye closure and smile. CN VIII: Hearing is normal to casual conversation CN IX, X: Palate elevates symmetrically. Phonation is normal. CN XI: Head turning  and shoulder shrug are intact CN XII: Tongue is midline with normal movements and no atrophy.  MOTOR: Mild left arm, and left lower extremity weakness  REFLEXES: Reflexes are 3 and symmetric at the biceps, triceps, knees,    SENSORY: Rash broke out noted at left T4-C7 dermatome, allodynia  COORDINATION: Rapid alternating movements and fine finger movements are intact. There is no dysmetria on finger-to-nose and heel-knee-shin.    GAIT/STANCE: Push-up from seated position, cautious   REVIEW OF SYSTEMS: Out of a complete 14 system review of symptoms, the patient complains only of the following symptoms, and all other reviewed systems are negative.  See HPI  ALLERGIES: No Known Allergies  HOME MEDICATIONS: Outpatient Medications Prior to Visit  Medication Sig Dispense Refill   atorvastatin (LIPITOR) 20 MG tablet 20 mg.     Cyanocobalamin (B12 LIQUID HEALTH BOOSTER PO) Take by mouth daily.     DULoxetine  (CYMBALTA ) 60 MG capsule Take 1 capsule (60 mg total) by mouth daily. 90 capsule 3   estradiol (ESTRACE) 2 MG tablet Take 2 mg by mouth daily.     ibuprofen (ADVIL,MOTRIN) 200 MG tablet Take 400 mg by mouth every 6 (six) hours as needed. For pain     MAGNESIUM PO Take 1 tablet by mouth at bedtime.     ocrelizumab  600 mg in sodium chloride  0.9 % 500 mL Inject 600 mg into the vein every 6 (six) months. 1 each 1   oxybutynin  (DITROPAN -XL) 10 MG 24 hr tablet Take 1 tablet (  10 mg total) by mouth at bedtime. 90 tablet 3   traZODone  (DESYREL ) 100 MG tablet TAKE 1 TABLET(100 MG) BY MOUTH AT BEDTIME 90 tablet 3   sertraline  (ZOLOFT ) 100 MG tablet TAKE 1 TABLET(100 MG) BY MOUTH DAILY (Patient taking differently: Take 50 mg by mouth daily. TAKE 1 TABLET(100 MG) BY MOUTH DAILY) 90 tablet 3   No facility-administered medications prior to visit.    PAST MEDICAL HISTORY: Past Medical History:  Diagnosis Date   Suzanne (multiple sclerosis) 07/13/12   Uterine cancer (HCC) 2000    PAST SURGICAL  HISTORY: Past Surgical History:  Procedure Laterality Date   ABDOMINAL HYSTERECTOMY  2000    FAMILY HISTORY: Family History  Problem Relation Age of Onset   Lung cancer Father    Breast cancer Neg Hx     SOCIAL HISTORY: Social History   Socioeconomic History   Marital status: Married    Spouse name: Not on file   Number of children: 2   Years of education: 14   Highest education level: Not on file  Occupational History   Occupation: Psychologist, Sport [employer] Exercise: [employer]  Tobacco Use   Smoking status: Never   Smokeless tobacco: Never  Vaping Use   Vaping status: Never Used  Substance and Sexual Activity   Alcohol use: No   Drug use: No   Sexual activity: Yes    Birth control/protection: None  Other Topics Concern   Not on file  Social History Narrative   Patient lives at home with her daughter. Patient has a two years of college education.   Right handed.   Caffeine- two sodas daily.   Patient is separated.    Social Drivers of Health   Tobacco Use: Low Risk (08/22/2024)   Patient History    Smoking Tobacco Use: Never    Smokeless Tobacco Use: Never    Passive Exposure: Not on file  Financial Resource Strain: Low Risk (08/04/2022)   Received from [facility]   Overall Financial Resource Strain (CARDIA)    Difficulty of Paying Living Expenses: Not hard at all  Food Insecurity: No Food Insecurity (08/04/2022)   Received from [facility]   Epic    Within the past 12 months, you worried that your food would run out before you got the money to buy more.: Never true    Within the past 12 months, the food you bought just didn't last and you didn't have money to get more.: Never true  Transportation Needs: No Transportation Needs (08/04/2022)   Received from [facility]   PRAPARE - Transportation    Lack of Transportation (Medical): No    Lack of Transportation (Non-Medical): No  Physical Activity: Not on file  Stress: No Stress Concern Present (08/04/2022)    Received from [facility] - Occupational Stress Questionnaire    Feeling of Stress : Not at all  Social Connections: Not on file  Intimate Partner Violence: Not on file  Depression (PHQ2-9): Not on file  Alcohol Screen: Not on file  Housing: Not on file  Utilities: Not on file  Health Literacy: Not on file   Modena Callander, M.D. Ph.D.  [facility] [address] [place], [place] [address] Phone: [phone] Fax:      [phone]   Total time spent reviewing the chart, obtaining history, examined patient, ordering tests, documentation, consultations and family, care coordination was  56 minutes

## 2024-08-24 ENCOUNTER — Other Ambulatory Visit

## 2024-08-25 ENCOUNTER — Ambulatory Visit: Payer: Self-pay | Admitting: Neurology

## 2024-08-25 LAB — CBC WITH DIFFERENTIAL/PLATELET
Basophils Absolute: 0 x10E3/uL (ref 0.0–0.2)
Basos: 1 %
EOS (ABSOLUTE): 0.1 x10E3/uL (ref 0.0–0.4)
Eos: 1 %
Hematocrit: 38.8 % (ref 34.0–46.6)
Hemoglobin: 12.6 g/dL (ref 11.1–15.9)
Immature Grans (Abs): 0 x10E3/uL (ref 0.0–0.1)
Immature Granulocytes: 0 %
Lymphocytes Absolute: 1.1 x10E3/uL (ref 0.7–3.1)
Lymphs: 20 %
MCH: 30.7 pg (ref 26.6–33.0)
MCHC: 32.5 g/dL (ref 31.5–35.7)
MCV: 95 fL (ref 79–97)
Monocytes Absolute: 0.6 x10E3/uL (ref 0.1–0.9)
Monocytes: 10 %
Neutrophils Absolute: 3.8 x10E3/uL (ref 1.4–7.0)
Neutrophils: 67 %
Platelets: 235 x10E3/uL (ref 150–450)
RBC: 4.1 x10E6/uL (ref 3.77–5.28)
RDW: 12.2 % (ref 11.7–15.4)
WBC: 5.6 x10E3/uL (ref 3.4–10.8)

## 2024-08-25 LAB — COMPREHENSIVE METABOLIC PANEL WITH GFR
ALT: 12 IU/L (ref 0–32)
AST: 18 IU/L (ref 0–40)
Albumin: 4.5 g/dL (ref 3.8–4.9)
Alkaline Phosphatase: 71 IU/L (ref 49–135)
BUN/Creatinine Ratio: 9 (ref 9–23)
BUN: 11 mg/dL (ref 6–24)
Bilirubin Total: 0.7 mg/dL (ref 0.0–1.2)
CO2: 22 mmol/L (ref 20–29)
Calcium: 9.2 mg/dL (ref 8.7–10.2)
Chloride: 100 mmol/L (ref 96–106)
Creatinine, Ser: 1.18 mg/dL — ABNORMAL HIGH (ref 0.57–1.00)
Globulin, Total: 1.9 g/dL (ref 1.5–4.5)
Glucose: 91 mg/dL (ref 70–99)
Potassium: 4 mmol/L (ref 3.5–5.2)
Sodium: 137 mmol/L (ref 134–144)
Total Protein: 6.4 g/dL (ref 6.0–8.5)
eGFR: 55 mL/min/1.73 — ABNORMAL LOW

## 2024-08-25 LAB — IGG, IGA, IGM
IgG (Immunoglobin G), Serum: 691 mg/dL (ref 586–1602)
IgM (Immunoglobulin M), Srm: 33 mg/dL (ref 26–217)
Immunoglobulin A, (IgA) QN, Serum: 76 mg/dL — ABNORMAL LOW (ref 87–352)

## 2024-08-25 LAB — TSH: TSH: 1.27 u[IU]/mL (ref 0.450–4.500)

## 2024-09-21 ENCOUNTER — Other Ambulatory Visit: Payer: Self-pay

## 2025-03-14 ENCOUNTER — Ambulatory Visit: Admitting: Neurology
